# Patient Record
Sex: Female | Born: 1965
Health system: Southern US, Community
[De-identification: ages and names within clinical notes are randomized; demographics above are authoritative.]

## PROBLEM LIST (undated history)

## (undated) DIAGNOSIS — I10 Essential (primary) hypertension: Secondary | ICD-10-CM

## (undated) DIAGNOSIS — E119 Type 2 diabetes mellitus without complications: Secondary | ICD-10-CM

## (undated) HISTORY — PX: JOINT REPLACEMENT: SHX530

---

## 2007-10-25 ENCOUNTER — Ambulatory Visit: Payer: Self-pay | Admitting: Nurse Practitioner

## 2008-10-28 ENCOUNTER — Ambulatory Visit: Payer: Self-pay | Admitting: Nurse Practitioner

## 2010-07-06 ENCOUNTER — Ambulatory Visit: Payer: Self-pay | Admitting: Family

## 2012-05-07 ENCOUNTER — Ambulatory Visit: Payer: Self-pay | Admitting: Internal Medicine

## 2013-05-13 ENCOUNTER — Ambulatory Visit: Payer: Self-pay | Admitting: Nurse Practitioner

## 2014-06-25 DIAGNOSIS — E119 Type 2 diabetes mellitus without complications: Secondary | ICD-10-CM | POA: Insufficient documentation

## 2014-06-25 DIAGNOSIS — I10 Essential (primary) hypertension: Secondary | ICD-10-CM | POA: Insufficient documentation

## 2014-06-25 DIAGNOSIS — M1711 Unilateral primary osteoarthritis, right knee: Secondary | ICD-10-CM | POA: Insufficient documentation

## 2014-07-16 DIAGNOSIS — Z96651 Presence of right artificial knee joint: Secondary | ICD-10-CM | POA: Insufficient documentation

## 2015-04-12 ENCOUNTER — Other Ambulatory Visit: Payer: Self-pay | Admitting: Family Medicine

## 2015-04-12 DIAGNOSIS — Z1231 Encounter for screening mammogram for malignant neoplasm of breast: Secondary | ICD-10-CM

## 2015-04-13 ENCOUNTER — Other Ambulatory Visit: Payer: Self-pay | Admitting: Family Medicine

## 2015-04-13 DIAGNOSIS — R51 Headache: Principal | ICD-10-CM

## 2015-04-13 DIAGNOSIS — R519 Headache, unspecified: Secondary | ICD-10-CM

## 2015-04-14 ENCOUNTER — Ambulatory Visit
Admission: RE | Admit: 2015-04-14 | Discharge: 2015-04-14 | Disposition: A | Discharge status: Home / Self Care | Payer: BC Managed Care – PPO | Source: Ambulatory Visit | Attending: Family Medicine | Admitting: Family Medicine

## 2015-04-14 DIAGNOSIS — Z1231 Encounter for screening mammogram for malignant neoplasm of breast: Secondary | ICD-10-CM | POA: Diagnosis not present

## 2015-04-19 ENCOUNTER — Ambulatory Visit
Admission: RE | Admit: 2015-04-19 | Discharge: 2015-04-19 | Disposition: A | Discharge status: Home / Self Care | Payer: BC Managed Care – PPO | Source: Ambulatory Visit | Attending: Family Medicine | Admitting: Family Medicine

## 2015-04-19 DIAGNOSIS — R519 Headache, unspecified: Secondary | ICD-10-CM

## 2015-04-19 DIAGNOSIS — R51 Headache: Secondary | ICD-10-CM | POA: Diagnosis present

## 2015-07-16 ENCOUNTER — Ambulatory Visit: Payer: BC Managed Care – PPO | Attending: Otolaryngology

## 2015-07-16 DIAGNOSIS — G2581 Restless legs syndrome: Secondary | ICD-10-CM | POA: Insufficient documentation

## 2015-07-16 DIAGNOSIS — R0683 Snoring: Secondary | ICD-10-CM | POA: Insufficient documentation

## 2015-07-16 DIAGNOSIS — R51 Headache: Secondary | ICD-10-CM | POA: Diagnosis present

## 2015-07-16 DIAGNOSIS — G4733 Obstructive sleep apnea (adult) (pediatric): Secondary | ICD-10-CM | POA: Insufficient documentation

## 2016-06-08 DIAGNOSIS — M1990 Unspecified osteoarthritis, unspecified site: Secondary | ICD-10-CM | POA: Insufficient documentation

## 2017-02-26 ENCOUNTER — Other Ambulatory Visit: Payer: Self-pay | Admitting: Student

## 2017-02-26 DIAGNOSIS — Z1231 Encounter for screening mammogram for malignant neoplasm of breast: Secondary | ICD-10-CM

## 2017-03-13 ENCOUNTER — Ambulatory Visit
Admission: RE | Admit: 2017-03-13 | Discharge: 2017-03-13 | Disposition: A | Discharge status: Home / Self Care | Payer: BC Managed Care – PPO | Source: Ambulatory Visit | Attending: Student | Admitting: Student

## 2017-03-13 DIAGNOSIS — Z1231 Encounter for screening mammogram for malignant neoplasm of breast: Secondary | ICD-10-CM | POA: Insufficient documentation

## 2017-03-13 DIAGNOSIS — R928 Other abnormal and inconclusive findings on diagnostic imaging of breast: Secondary | ICD-10-CM | POA: Diagnosis not present

## 2017-03-23 ENCOUNTER — Other Ambulatory Visit: Payer: Self-pay | Admitting: Student

## 2017-03-23 DIAGNOSIS — R928 Other abnormal and inconclusive findings on diagnostic imaging of breast: Secondary | ICD-10-CM

## 2017-03-23 DIAGNOSIS — N6489 Other specified disorders of breast: Secondary | ICD-10-CM

## 2017-03-27 ENCOUNTER — Ambulatory Visit
Admission: RE | Admit: 2017-03-27 | Discharge: 2017-03-27 | Disposition: A | Discharge status: Home / Self Care | Payer: BC Managed Care – PPO | Source: Ambulatory Visit | Attending: Student | Admitting: Student

## 2017-03-27 DIAGNOSIS — N6489 Other specified disorders of breast: Secondary | ICD-10-CM | POA: Diagnosis present

## 2017-03-27 DIAGNOSIS — R928 Other abnormal and inconclusive findings on diagnostic imaging of breast: Secondary | ICD-10-CM

## 2017-03-27 DIAGNOSIS — N6001 Solitary cyst of right breast: Secondary | ICD-10-CM | POA: Diagnosis not present

## 2017-12-24 ENCOUNTER — Ambulatory Visit (HOSPITAL_COMMUNITY)
Admission: RE | Admit: 2017-12-24 | Discharge: 2017-12-24 | Disposition: A | Discharge status: Home / Self Care | Payer: BC Managed Care – PPO | Source: Ambulatory Visit | Attending: Family Medicine | Admitting: Family Medicine

## 2017-12-24 ENCOUNTER — Other Ambulatory Visit (HOSPITAL_COMMUNITY): Payer: Self-pay | Admitting: Family Medicine

## 2017-12-24 DIAGNOSIS — M25511 Pain in right shoulder: Secondary | ICD-10-CM | POA: Diagnosis present

## 2017-12-24 DIAGNOSIS — M19011 Primary osteoarthritis, right shoulder: Secondary | ICD-10-CM | POA: Diagnosis not present

## 2017-12-25 ENCOUNTER — Encounter (INDEPENDENT_AMBULATORY_CARE_PROVIDER_SITE_OTHER): Payer: Self-pay | Admitting: *Deleted

## 2018-02-26 ENCOUNTER — Ambulatory Visit: Payer: Self-pay | Admitting: Orthopaedic Surgery

## 2018-02-27 ENCOUNTER — Encounter: Payer: Self-pay | Admitting: Orthopaedic Surgery

## 2018-02-27 ENCOUNTER — Telehealth: Payer: Self-pay | Admitting: Orthopaedic Surgery

## 2018-02-27 NOTE — Telephone Encounter (Signed)
No note. Letter sent to patient re: missed appointment. Notes entered into Referral Workqueue.

## 2018-03-06 ENCOUNTER — Other Ambulatory Visit: Payer: Self-pay | Admitting: Family Medicine

## 2018-03-06 DIAGNOSIS — Z1231 Encounter for screening mammogram for malignant neoplasm of breast: Secondary | ICD-10-CM

## 2018-03-26 ENCOUNTER — Ambulatory Visit: Payer: Self-pay | Admitting: Orthopaedic Surgery

## 2018-04-15 ENCOUNTER — Ambulatory Visit
Admission: RE | Admit: 2018-04-15 | Discharge: 2018-04-15 | Disposition: A | Discharge status: Home / Self Care | Payer: PRIVATE HEALTH INSURANCE | Source: Ambulatory Visit | Attending: Family Medicine | Admitting: Family Medicine

## 2018-04-15 DIAGNOSIS — Z1231 Encounter for screening mammogram for malignant neoplasm of breast: Secondary | ICD-10-CM | POA: Diagnosis not present

## 2018-04-17 ENCOUNTER — Ambulatory Visit: Payer: Self-pay | Admitting: Orthopaedic Surgery

## 2018-04-25 ENCOUNTER — Ambulatory Visit: Payer: Self-pay | Admitting: Orthopaedic Surgery

## 2018-05-16 ENCOUNTER — Ambulatory Visit: Payer: Self-pay | Admitting: Orthopaedic Surgery

## 2018-06-18 ENCOUNTER — Ambulatory Visit: Payer: PRIVATE HEALTH INSURANCE | Admitting: Orthopaedic Surgery

## 2018-08-21 ENCOUNTER — Encounter (HOSPITAL_COMMUNITY): Payer: Self-pay | Admitting: Emergency Medicine

## 2018-08-21 ENCOUNTER — Other Ambulatory Visit: Payer: Self-pay

## 2018-08-21 ENCOUNTER — Emergency Department (HOSPITAL_COMMUNITY)
Admission: EM | Admit: 2018-08-21 | Discharge: 2018-08-21 | Disposition: A | Discharge status: Home / Self Care | Payer: PRIVATE HEALTH INSURANCE | Attending: Emergency Medicine | Admitting: Emergency Medicine

## 2018-08-21 DIAGNOSIS — M25511 Pain in right shoulder: Secondary | ICD-10-CM | POA: Diagnosis not present

## 2018-08-21 MED ORDER — NAPROXEN 250 MG PO TABS
500.0000 mg | ORAL_TABLET | Freq: Once | ORAL | Status: AC
  Administered 2018-08-21: 500 mg via ORAL
  Filled 2018-08-21: qty 2

## 2018-08-21 MED ORDER — HYDROCODONE-ACETAMINOPHEN 5-325 MG PO TABS: 1.0000 | ORAL_TABLET | ORAL | 0 refills | Status: DC | PRN

## 2018-08-21 MED ORDER — NAPROXEN 500 MG PO TABS: 500.0000 mg | ORAL_TABLET | Freq: Two times a day (BID) | ORAL | 0 refills | Status: DC

## 2018-08-21 NOTE — Discharge Instructions (Addendum)
Apply ice for thirty minutes at a time, four times a day.  Take acetaminophen as needed for additional pain relief. 

## 2018-08-21 NOTE — ED Triage Notes (Signed)
Pt c/o right shoulder pain that she woke up with Sunday, denies injury, no relief with ice, heat, or tylenol

## 2018-08-21 NOTE — ED Provider Notes (Signed)
[facility] EMERGENCY DEPARTMENT Provider Note   CSN: [id number] Arrival date & time: 08/21/18  0033    History   Chief Complaint Right shoulder pain.  HPI Betty Wu is a 53 y.o. female.   The history is provided by the patient.  She complains of pain in the right shoulder for the last 2 days.  Pain sometimes radiates into the right upper arm.  Pain is dull and she rates it a 10/10.  It is worse with movement.  There has been some numbness in all the fingers of her right hand.  She denies any trauma or unusual lifting.  She did have a similar episode several months ago along with his told that it was probably rotator cuff problem and was referred to orthopedics, but never followed through with that consultation because of COVID-19.  She has not taken anything for pain at home.  History reviewed. No pertinent past medical history.  There are no active problems to display for this patient.   History reviewed. No pertinent surgical history.   OB History   No obstetric history on file.      Home Medications    Prior to Admission medications   Not on File    Family History Family History  Problem Relation Age of Onset  . Breast cancer Maternal Aunt        40's  . Breast cancer Maternal Grandmother        50's  . Breast cancer Cousin        2 maternal cousins    Social History Social History   Tobacco Use  . Smoking status: Never Smoker  . Smokeless tobacco: Never Used  Substance Use Topics  . Alcohol use: Not Currently  . Drug use: Not Currently     Allergies   Patient has no known allergies.   Review of Systems Review of Systems  All other systems reviewed and are negative.    Physical Exam Updated Vital Signs BP (!) 146/75 (BP Location: Left Arm)   Pulse 63   Temp 98.1 F (36.7 C) (Oral)   Resp 17   Ht 5\' 4"  (1.626 m)   Wt 93 kg   SpO2 99%   BMI 35.19 kg/m   Physical Exam Vitals signs and nursing note reviewed.    53 year old  female, resting comfortably and in no acute distress. Vital signs are significant for mildly elevated blood pressure. Oxygen saturation is 99%, which is normal. Head is normocephalic and atraumatic. PERRLA, EOMI. Oropharynx is clear. Neck is nontender and supple without adenopathy or JVD. Back is nontender and there is no CVA tenderness. Lungs are clear without rales, wheezes, or rhonchi. Chest is nontender. Heart has regular rate and rhythm without murmur. Abdomen is soft, flat, nontender without masses or hepatosplenomegaly and peristalsis is normoactive. Extremities have no cyanosis or edema.  There is tenderness rather diffusely throughout the right shoulder with maximum tenderness in the anterior deltoid groove.  There is pain with passive range of motion, and range of motion is somewhat restricted.  Rotator cuff impingement signs are present. Skin is warm and dry without rash. Neurologic: Mental status is normal, cranial nerves are intact, there are no motor or sensory deficits.  ED Treatments / Results   Procedures Procedures  Medications Ordered in ED Medications  naproxen (NAPROSYN) tablet 500 mg (has no administration in time range)     Initial Impression / Assessment and Plan / ED Course  I have reviewed  the triage vital signs and the nursing notes.  Right shoulder pain that clinically seems most likely to be rotator cuff impingement syndrome.  Old records are reviewed, and right shoulder x-ray last November showed subacromial spurring that can result in rotator cuff injury.  With no recent trauma, no indication for repeat x-ray.  She is given a prescription for naproxen and a take-home pack of hydrocodone-acetaminophen.  She is referred to orthopedics for further outpatient work-up and treatment.  Final Clinical Impressions(s) / ED Diagnoses   Final diagnoses:  Acute pain of right shoulder    ED Discharge Orders         Ordered    naproxen (NAPROSYN) 500 MG tablet  2  times daily     08/21/18 0158    HYDROcodone-acetaminophen (NORCO) 5-325 MG tablet  Every 4 hours PRN     08/21/18 0203           Dione BoozeGlick, Philipe Laswell, MD 08/21/18 0207

## 2018-08-22 MED FILL — Hydrocodone-Acetaminophen Tab 5-325 MG: ORAL | Qty: 6 | Status: AC

## 2018-08-23 DIAGNOSIS — M7581 Other shoulder lesions, right shoulder: Secondary | ICD-10-CM | POA: Insufficient documentation

## 2018-08-27 ENCOUNTER — Ambulatory Visit: Payer: PRIVATE HEALTH INSURANCE | Admitting: Orthopaedic Surgery

## 2018-09-25 ENCOUNTER — Telehealth: Payer: Self-pay | Admitting: Orthopaedic Surgery

## 2018-09-25 NOTE — Telephone Encounter (Signed)
Betty Wu called this morning stating that she needed to schedule an appointment here.  She then stated that she has been seeing another physician in [facility] but doesn't want to go back there.  She said she had an MRI done and she has a tear.  She wants to be seen here.  I told her that she would need to get all medical notes, MRI report and disc so that Dr. Aline Brochure can review.  She said she would do this.  I did not realize at that time I was speaking with her but she previously has had multiple scheduled appointments with Dr. Luna Glasgow in the past but has either been a no show or called and canceled the appointment.

## 2018-09-26 ENCOUNTER — Encounter (HOSPITAL_COMMUNITY): Payer: Self-pay | Admitting: Emergency Medicine

## 2018-09-26 ENCOUNTER — Emergency Department (HOSPITAL_COMMUNITY)
Admission: EM | Admit: 2018-09-26 | Discharge: 2018-09-26 | Disposition: A | Discharge status: Home / Self Care | Payer: PRIVATE HEALTH INSURANCE | Attending: Emergency Medicine | Admitting: Emergency Medicine

## 2018-09-26 ENCOUNTER — Other Ambulatory Visit: Payer: Self-pay

## 2018-09-26 DIAGNOSIS — R112 Nausea with vomiting, unspecified: Secondary | ICD-10-CM | POA: Diagnosis not present

## 2018-09-26 DIAGNOSIS — R197 Diarrhea, unspecified: Secondary | ICD-10-CM

## 2018-09-26 DIAGNOSIS — Z20828 Contact with and (suspected) exposure to other viral communicable diseases: Secondary | ICD-10-CM | POA: Insufficient documentation

## 2018-09-26 DIAGNOSIS — Z79899 Other long term (current) drug therapy: Secondary | ICD-10-CM | POA: Insufficient documentation

## 2018-09-26 DIAGNOSIS — E876 Hypokalemia: Secondary | ICD-10-CM | POA: Insufficient documentation

## 2018-09-26 DIAGNOSIS — E119 Type 2 diabetes mellitus without complications: Secondary | ICD-10-CM | POA: Insufficient documentation

## 2018-09-26 DIAGNOSIS — I10 Essential (primary) hypertension: Secondary | ICD-10-CM | POA: Diagnosis not present

## 2018-09-26 DIAGNOSIS — Z7189 Other specified counseling: Secondary | ICD-10-CM

## 2018-09-26 HISTORY — DX: Essential (primary) hypertension: I10

## 2018-09-26 HISTORY — DX: Type 2 diabetes mellitus without complications: E11.9

## 2018-09-26 LAB — URINALYSIS, ROUTINE W REFLEX MICROSCOPIC
Bacteria, UA: NONE SEEN
Bilirubin Urine: NEGATIVE
Glucose, UA: NEGATIVE mg/dL
Hgb urine dipstick: NEGATIVE
Ketones, ur: NEGATIVE mg/dL
Nitrite: NEGATIVE
Protein, ur: NEGATIVE mg/dL
Specific Gravity, Urine: 1.019 (ref 1.005–1.030)
pH: 6 (ref 5.0–8.0)

## 2018-09-26 LAB — COMPREHENSIVE METABOLIC PANEL
ALT: 15 U/L (ref 0–44)
AST: 12 U/L — ABNORMAL LOW (ref 15–41)
Albumin: 3.8 g/dL (ref 3.5–5.0)
Alkaline Phosphatase: 57 U/L (ref 38–126)
Anion gap: 6 (ref 5–15)
BUN: 11 mg/dL (ref 6–20)
CO2: 29 mmol/L (ref 22–32)
Calcium: 9.1 mg/dL (ref 8.9–10.3)
Chloride: 102 mmol/L (ref 98–111)
Creatinine, Ser: 0.64 mg/dL (ref 0.44–1.00)
GFR calc Af Amer: >60 mL/min (ref >60)
GFR calc non Af Amer: >60 mL/min (ref >60)
Glucose, Bld: 86 mg/dL (ref 70–99)
Potassium: 3.3 mmol/L — ABNORMAL LOW (ref 3.5–5.1)
Sodium: 137 mmol/L (ref 135–145)
Total Bilirubin: 0.7 mg/dL (ref 0.3–1.2)
Total Protein: 7.9 g/dL (ref 6.5–8.1)

## 2018-09-26 LAB — CBC
HCT: 42.8 % (ref 36.0–46.0)
Hemoglobin: 13.2 g/dL (ref 12.0–15.0)
MCH: 27.1 pg (ref 26.0–34.0)
MCHC: 30.8 g/dL (ref 30.0–36.0)
MCV: 87.9 fL (ref 80.0–100.0)
Platelets: 262 10*3/uL (ref 150–400)
RBC: 4.87 MIL/uL (ref 3.87–5.11)
RDW: 14.3 % (ref 11.5–15.5)
WBC: 8.9 10*3/uL (ref 4.0–10.5)
nRBC: 0 % (ref 0.0–0.2)

## 2018-09-26 LAB — POC URINE PREG, ED: Preg Test, Ur: NEGATIVE

## 2018-09-26 LAB — LIPASE, BLOOD: Lipase: 26 U/L (ref 11–51)

## 2018-09-26 MED ORDER — SODIUM CHLORIDE 0.9 % IV BOLUS
1000.0000 mL | Freq: Once | INTRAVENOUS | Status: AC
  Administered 2018-09-26: 1000 mL via INTRAVENOUS

## 2018-09-26 MED ORDER — DICYCLOMINE HCL 20 MG PO TABS: 20.0000 mg | ORAL_TABLET | Freq: Two times a day (BID) | ORAL | 0 refills | Status: DC

## 2018-09-26 MED ORDER — MORPHINE SULFATE (PF) 4 MG/ML IV SOLN
4.0000 mg | Freq: Once | INTRAVENOUS | Status: AC
  Administered 2018-09-26: 14:00:00 4 mg via INTRAVENOUS
  Filled 2018-09-26: qty 1

## 2018-09-26 MED ORDER — METOCLOPRAMIDE HCL 5 MG/ML IJ SOLN
10.0000 mg | Freq: Once | INTRAMUSCULAR | Status: AC
  Administered 2018-09-26: 10 mg via INTRAVENOUS
  Filled 2018-09-26: qty 2

## 2018-09-26 MED ORDER — ONDANSETRON 4 MG PO TBDP: 4.0000 mg | ORAL_TABLET | Freq: Three times a day (TID) | ORAL | 0 refills | Status: DC | PRN

## 2018-09-26 MED ORDER — POTASSIUM CHLORIDE CRYS ER 20 MEQ PO TBCR
40.0000 meq | EXTENDED_RELEASE_TABLET | Freq: Once | ORAL | Status: AC
  Administered 2018-09-26: 40 meq via ORAL
  Filled 2018-09-26: qty 2

## 2018-09-26 MED ORDER — SODIUM CHLORIDE 0.9% FLUSH: 3.0000 mL | Freq: Once | INTRAVENOUS | Status: DC

## 2018-09-26 NOTE — ED Notes (Signed)
Patient given ginger ale as PO Challenge

## 2018-09-26 NOTE — Discharge Instructions (Signed)
We will contact you with the results of your remaining lab work when it is available. Take the Zofran as needed for nausea, Bentyl as needed for abdominal discomfort.  Please follow-up with your primary care provider. Return to the ED if you start to have worsening symptoms, increased abdominal pain or develop a fever, blood in your stool or vomit, lightheadedness or loss of consciousness.

## 2018-09-26 NOTE — ED Triage Notes (Signed)
Patient reports abdominal pain with vomiting and diarrhea that started last night.

## 2018-09-26 NOTE — ED Provider Notes (Signed)
[facility]ENC[facility]PART[facility] Provider Note   CSN: [id number] Arrival date & time: 09/26/18  1221    History   Chief Complaint Chief Complaint  Patient presents with   Abdominal Pain    HPI Betty Wu is a 53 y.o. female with a past medical history of HTN, who presents to ED for generalized abdominal cramping, several episodes of nonbloody diarrhea, and several episodes of NBNB emesis since last night. States that she woke up yesterday with a decreased appetite.  She ate a meal at [employer] for lunch.  States that she did not eat dinner because of her feelings of nausea and abdominal cramping.  States that she is unable to get her symptoms under control but has not tried any over-the-counter medications to help with her symptoms.  She denies any sick contacts with similar symptoms, known COVID-19 exposures, urinary or vaginal complaints, fever, shortness of breath.  Denies any prior abdominal surgeries.  Denies any alcohol, tobacco or other drug use.     HPI  Past Medical History:  Diagnosis Date   Diabetes mellitus without complication (HCC)    prediabetic   Hypertension     There are no active problems to display for this patient.   Past Surgical History:  Procedure Laterality Date   JOINT REPLACEMENT       OB History    Gravida  3   Para  2   Term  1   Preterm  1   AB  1   Living        SAB  1   TAB      Ectopic      Multiple      Live Births               Home Medications    Prior to Admission medications   Medication Sig Start Date End Date Taking? Authorizing Provider  lisinopril-hydrochlorothiazide (ZESTORETIC) 20-25 MG tablet Take 1 tablet by mouth daily. 09/07/18  Yes [provider]  loratadine (CLARITIN) 10 MG tablet Take 10 mg by mouth daily. 07/26/18  Yes [provider]  dicyclomine (BENTYL) 20 MG tablet Take 1 tablet (20 mg total) by mouth 2 (two) times daily. 09/26/18   Kang Ishida, PA-C    HYDROcodone-acetaminophen (NORCO) 5-325 MG tablet Take 1 tablet by mouth every 4 (four) hours as needed for moderate pain. Patient not taking: Reported on 09/26/2018 08/21/18   Dione BoozeGlick, David, MD  naproxen (NAPROSYN) 500 MG tablet Take 1 tablet (500 mg total) by mouth 2 (two) times daily. Patient not taking: Reported on 09/26/2018 08/21/18   Dione BoozeGlick, David, MD  ondansetron (ZOFRAN ODT) 4 MG disintegrating tablet Take 1 tablet (4 mg total) by mouth every 8 (eight) hours as needed for nausea or vomiting. 09/26/18   Dietrich PatesKhatri, Srihitha Tagliaferri, PA-C    Family History Family History  Problem Relation Age of Onset   Breast cancer Maternal Aunt        40's   Breast cancer Maternal Grandmother        50's   Breast cancer Cousin        2 maternal cousins    Social History Social History   Tobacco Use   Smoking status: Never Smoker   Smokeless tobacco: Never Used  Substance Use Topics   Alcohol use: Not Currently   Drug use: Not Currently     Allergies   Patient has no known allergies.   Review of Systems Review of Systems  Constitutional: Negative  for appetite change, chills and fever.  HENT: Negative for ear pain, rhinorrhea, sneezing and sore throat.   Eyes: Negative for photophobia and visual disturbance.  Respiratory: Negative for cough, chest tightness, shortness of breath and wheezing.   Cardiovascular: Negative for chest pain and palpitations.  Gastrointestinal: Positive for abdominal pain, diarrhea, nausea and vomiting. Negative for blood in stool and constipation.  Genitourinary: Negative for dysuria, hematuria and urgency.  Musculoskeletal: Negative for myalgias.  Skin: Negative for rash.  Neurological: Negative for dizziness, weakness and light-headedness.     Physical Exam Updated Vital Signs BP 139/89 (BP Location: Right Arm)    Pulse 82    Temp 98.4 F (36.9 C) (Oral)    Resp 20    Ht 5\' 4"  (1.626 m)    Wt 95.3 kg    SpO2 100%    BMI 36.05 kg/m   Physical Exam Vitals  signs and nursing note reviewed.  Constitutional:      General: She is not in acute distress.    Appearance: She is well-developed.  HENT:     Head: Normocephalic and atraumatic.     Nose: Nose normal.  Eyes:     General: No scleral icterus.       Left eye: No discharge.     Conjunctiva/sclera: Conjunctivae normal.  Neck:     Musculoskeletal: Normal range of motion and neck supple.  Cardiovascular:     Rate and Rhythm: Normal rate and regular rhythm.     Heart sounds: Normal heart sounds. No murmur. No friction rub. No gallop.   Pulmonary:     Effort: Pulmonary effort is normal. No respiratory distress.     Breath sounds: Normal breath sounds.  Abdominal:     General: Bowel sounds are normal. There is no distension.     Palpations: Abdomen is soft.     Tenderness: There is generalized abdominal tenderness. There is no guarding or rebound.  Musculoskeletal: Normal range of motion.  Skin:    General: Skin is warm and dry.     Findings: No rash.  Neurological:     Mental Status: She is alert.     Motor: No abnormal muscle tone.     Coordination: Coordination normal.      ED Treatments / Results  Labs (all labs ordered are listed, but only abnormal results are displayed) Labs Reviewed  COMPREHENSIVE METABOLIC PANEL - Abnormal; Notable for the following components:      Result Value   Potassium 3.3 (*)    AST 12 (*)    All other components within normal limits  URINALYSIS, ROUTINE W REFLEX MICROSCOPIC - Abnormal; Notable for the following components:   APPearance HAZY (*)    Leukocytes,Ua TRACE (*)    All other components within normal limits  NOVEL CORONAVIRUS, NAA (HOSPITAL ORDER, SEND-OUT TO REF LAB)  LIPASE, BLOOD  CBC  POC URINE PREG, ED    EKG None  Radiology No results found.  Procedures Procedures (including critical care time)  Medications Ordered in ED Medications  sodium chloride flush (NS) 0.9 % injection 3 mL (3 mLs Intravenous Not Given  09/26/18 1259)  sodium chloride 0.9 % bolus 1,000 mL (1,000 mLs Intravenous New Bag/Given 09/26/18 1347)  metoCLOPramide (REGLAN) injection 10 mg (10 mg Intravenous Given 09/26/18 1347)  morphine 4 MG/ML injection 4 mg (4 mg Intravenous Given 09/26/18 1347)  potassium chloride SA (K-DUR) CR tablet 40 mEq (40 mEq Oral Given 09/26/18 1436)     Initial Impression /  Assessment and Plan / ED Course  I have reviewed the triage vital signs and the nursing notes.  Pertinent labs & imaging results that were available during my care of the patient were reviewed by me and considered in my medical decision making (see chart for details).        Betty Wu was evaluated in Emergency Department on 09/26/18  for the symptoms described in the history of present illness. He/she was evaluated in the context of the global COVID-19 pandemic, which necessitated consideration that the patient might be at risk for infection with the SARS-CoV-2 virus that causes COVID-19. Institutional protocols and algorithms that pertain to the evaluation of patients at risk for COVID-19 are in a state of rapid change based on information released by regulatory bodies including the CDC and federal and state organizations. These policies and algorithms were followed during the patient's care in the ED.  53 year old female presents to ED for generalized abdominal cramping, vomiting, diarrhea that began yesterday.  She has not tried any interventions prior to arrival.  No sick contacts with similar symptoms.  Denies any fever, urinary symptoms, vaginal complaints or possibility of pregnancy.  On exam abdomen is generally tender without rebound or guarding.  No focal tenderness noted.  Her vital signs are reassuring, she appears hemodynamically stable.  Lab work significant for mild hypokalemia of 3.3 which was repleted orally, urinalysis with trace leukocytes.  Lipase, CBC unremarkable.  Patient given fluids, antiemetics and pain medication  with resolution of her symptoms.  Repeat abdominal exams are benign.  Patient able to tolerate p.o. potassium without difficulty.  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  I feel this is reasonable and will do send out COVID test.  Advised to quarantine until results.  Will give work note.  Give antiemetics and Bentyl for symptoms and advised PCP follow-up.  Suspect that her symptoms are viral in nature, doubt appendicitis, cholecystitis or other surgical emergent cause of her symptoms.  Will give strict return precautions for return to the ED.  Patient is hemodynamically stable, in NAD, and able to ambulate in the ED. Evaluation does not show pathology that would require ongoing emergent intervention or inpatient treatment. I explained the diagnosis to the patient. Pain has been managed and has no complaints prior to discharge. Patient is comfortable with above plan and is stable for discharge at this time. All questions were answered prior to disposition. Strict return precautions for returning to the ED were discussed. Encouraged follow up with PCP.   An After Visit Summary was printed and given to the patient.   Portions of this note were generated with Scientist, clinical (histocompatibility and immunogenetics)Dragon dictation software. Dictation errors may occur despite best attempts at proofreading.  Final Clinical Impressions(s) / ED Diagnoses   Final diagnoses:  Nausea vomiting and diarrhea  Educated About Covid-19 Virus Infection  Hypokalemia    ED Discharge Orders         Ordered    ondansetron (ZOFRAN ODT) 4 MG disintegrating tablet  Every 8 hours PRN     09/26/18 1431    dicyclomine (BENTYL) 20 MG tablet  2 times daily     09/26/18 [address]1431           Norinne Jeane, PA-C 09/26/18 1442    Milagros Lollykstra, Richard S, MD 09/27/18 [phone]

## 2018-09-27 LAB — SARS CORONAVIRUS 2 (TAT 6-24 HRS): SARS Coronavirus 2: NEGATIVE

## 2018-10-30 ENCOUNTER — Ambulatory Visit: Payer: Self-pay | Admitting: Orthopedic Surgery

## 2018-10-30 ENCOUNTER — Encounter: Payer: Self-pay | Admitting: Orthopedic Surgery

## 2018-10-30 ENCOUNTER — Other Ambulatory Visit: Payer: Self-pay

## 2018-10-30 VITALS — BP 125/74 | HR 77 | Ht 64.0 in | Wt 212.0 lb

## 2018-10-30 DIAGNOSIS — E6609 Other obesity due to excess calories: Secondary | ICD-10-CM | POA: Diagnosis not present

## 2018-10-30 DIAGNOSIS — I10 Essential (primary) hypertension: Secondary | ICD-10-CM | POA: Diagnosis not present

## 2018-10-30 DIAGNOSIS — T464X5A Adverse effect of angiotensin-converting-enzyme inhibitors, initial encounter: Secondary | ICD-10-CM | POA: Diagnosis not present

## 2018-10-30 DIAGNOSIS — Z23 Encounter for immunization: Secondary | ICD-10-CM | POA: Diagnosis not present

## 2018-10-30 DIAGNOSIS — R7309 Other abnormal glucose: Secondary | ICD-10-CM | POA: Diagnosis not present

## 2018-10-30 DIAGNOSIS — Z6836 Body mass index (BMI) 36.0-36.9, adult: Secondary | ICD-10-CM | POA: Diagnosis not present

## 2018-10-30 DIAGNOSIS — M75121 Complete rotator cuff tear or rupture of right shoulder, not specified as traumatic: Secondary | ICD-10-CM

## 2018-10-30 MED ORDER — HYDROCODONE-ACETAMINOPHEN 5-325 MG PO TABS: 1.0000 | ORAL_TABLET | Freq: Four times a day (QID) | ORAL | 0 refills | Status: AC | PRN

## 2018-10-30 MED ORDER — IBUPROFEN 800 MG PO TABS: 800.0000 mg | ORAL_TABLET | Freq: Three times a day (TID) | ORAL | 0 refills | Status: DC | PRN

## 2018-10-30 NOTE — Patient Instructions (Addendum)
You have received an injection of steroids into the joint. 15% of patients will have increased pain within the 24 hours postinjection.   This is transient and will go away.   We recommend that you use ice packs on the injection site for 20 minutes every 2 hours and extra strength Tylenol 2 tablets every 8 as needed until the pain resolves.  If you continue to have pain after taking the Tylenol and using the ice please call the office for further instructions.  Physical therapy will call you    Rotator Cuff Tear  A rotator cuff tear is a partial or complete tear of the cord-like bands (tendons) that connect muscle to bone in the rotator cuff. The rotator cuff is a group of muscles and tendons that surround the shoulder joint and keep the upper arm bone (humerus) in the shoulder socket. The tear can occur suddenly (acute tear) or can develop over a long period of time (chronic tear). What are the causes? Acute tears may be caused by:  A fall, especially on an outstretched arm.  Lifting very heavy objects with a jerking motion. Chronic tears may be caused by overuse of the muscles. This may happen in sports, physical work, or activities in which your arm repeatedly moves over your head. What increases the risk? This condition is more likely to occur in:  Athletes and workers who frequently use their shoulder or reach over their heads. This may include activities such as: ? Tennis. ? Baseball and softball. ? Swimming and rowing. ? Weightlifting. ? Architect work. ? Painting.  People who smoke.  Older people who have arthritis or poor blood supply. These can make the muscles and tendons weaker. What are the signs or symptoms? Symptoms of this condition depend on the type and severity of the injury:  An acute tear may include a sudden tearing feeling, followed by severe pain that goes from your upper shoulder, down your arm, and toward your elbow.  A chronic tear includes a  gradual weakness and decreased shoulder motion as the pain gets worse. The pain is usually worse at night. Both types may have symptoms such as:  Pain that spreads (radiates) from the shoulder to the upper arm.  Swelling and tenderness in front of the shoulder.  Decreased range of motion.  Pain when: ? Reaching, pulling, or lifting the arm above the head. ? Lowering the arm from above the head.  Not being able to raise your arm out to the side.  Difficulty placing the arm behind your back. How is this diagnosed? This condition is diagnosed with a medical history and physical exam. Imaging tests may also be done, including:  X-rays.  MRI.  Ultrasound.  CT or MR arthrogram. During this test, a contrast material is injected into your shoulder and then images are taken. How is this treated? Treatment for this condition depends on the type and severity of the condition. In less severe cases, treatment may include:  Rest. This may be done with a sling that holds the shoulder still (immobilization). Your health care provider may also recommend avoiding activities that involve lifting your arm over your head.  Icing the shoulder.  Anti-inflammatory medicines, such as aspirin or ibuprofen.  Strengthening and stretching exercises. Your health care provider may recommend specific exercises to improve your range of motion and strengthen your shoulder. In more severe cases, treatment may include:  Physical therapy.  Steroid injections.  Surgery. Follow these instructions at home: Managing pain,  stiffness, and swelling  If directed, put ice on the injured area. ? If you have a removable sling, remove it as told by your health care provider. ? Put ice in a plastic bag. ? Place a towel between your skin and the bag. ? Leave the ice on for 20 minutes, 2-3 times a day.  Raise (elevate) the injured area above the level of your heart while you are lying down.  Find a comfortable  sleeping position or sleep on a recliner, if available.  Move your fingers often to avoid stiffness and to lessen swelling.  Once the swelling has gone down, your health care provider may direct you to apply heat to relax the muscles. Use the heat source that your health care provider recommends, such as a moist heat pack or a heating pad. ? Place a towel between your skin and the heat source. ? Leave the heat on for 20-30 minutes. ? Remove the heat if your skin turns bright red. This is especially important if you are unable to feel pain, heat, or cold. You may have a greater risk of getting burned. If you have a sling:  Wear the sling as told by your health care provider. Remove it only as told by your health care provider.  Loosen the sling if your fingers tingle, become numb, or turn cold and blue.  Keep the sling clean.  If the sling is not waterproof: ? Do not let it get wet. ? Cover it with a watertight covering when you take a bath or a shower. Driving  Do not drive or use heavy machinery while taking prescription pain medicine.  Ask your health care provider when it is safe to drive if you have a sling on your arm. Activity  Rest your shoulder as told by your health care provider.  Return to your normal activities as told by your health care provider. Ask your health care provider what activities are safe for you.  Do any exercises or stretches as told by your health care provider. General instructions  Do not use any products that contain nicotine or tobacco, such as cigarettes and e-cigarettes. If you need help quitting, ask your health care provider.  Take over-the-counter and prescription medicines only as told by your health care provider.  Keep all follow-up visits as told by your health care provider. This is important. Contact a health care provider if:  Your pain gets worse.  You have new pain in your arm, hands, or fingers.  Medicine does not help your  pain. Get help right away if:  Your arm, hand, or fingers are numb or tingling.  Your arm, hand, or fingers are swollen or painful or they turn white or blue.  Your hand or fingers on your injured arm are colder than your other hand. Summary  A rotator cuff tear is a partial or complete tear of the cord-like bands (tendons) that connect muscle to bone in the rotator cuff.  The tear can occur suddenly (acute tear) or can develop over a long period of time (chronic tear).  Treatment generally includes rest, anti-inflammatory medicines, and icing. In some cases, physical therapy and steroid injections may be needed. In severe cases, surgery may be needed. This information is not intended to replace advice given to you by your health care provider. Make sure you discuss any questions you have with your health care provider. Document Released: 01/28/2000 Document Revised: 01/12/2017 Document Reviewed: 04/17/2016 Elsevier Patient Education  2020 [employer]  

## 2018-10-30 NOTE — Progress Notes (Signed)
Betty Wu  10/30/2018  HISTORY SECTION :  Chief Complaint  Patient presents with  . Shoulder Pain    right    HPI The patient presents for evaluation of right shoulder previously seen at [facility] told she needed surgery after MRI showed a large rotator cuff tear  Complains of right shoulder pain some stiffness occasional loss of motion but her range of motion has improved Quality dull ache Severity 10   Review of Systems  All other systems reviewed and are negative.    has a past medical history of Diabetes mellitus without complication ([place]) and Hypertension.   Past Surgical History:  Procedure Laterality Date  . JOINT REPLACEMENT      Body mass index is 36.39 kg/m.   No Known Allergies   Current Outpatient Medications:  .  loratadine (CLARITIN) 10 MG tablet, Take 10 mg by mouth daily., Disp: , Rfl:  .  lisinopril-hydrochlorothiazide (ZESTORETIC) 20-25 MG tablet, Take 1 tablet by mouth daily., Disp: , Rfl:    PHYSICAL EXAM SECTION: 1) BP 125/74   Pulse 77   Ht 5\' 4"  (1.626 m)   Wt 212 lb (96.2 kg)   BMI 36.39 kg/m   Body mass index is 36.39 kg/m. General appearance: Well-developed well-nourished no gross deformities  2) Cardiovascular normal pulse and perfusion in the upper extremities normal color without edema  3) Neurologically deep tendon reflexes are equal and normal, no sensation loss or deficits no pathologic reflexes  4) Psychological: Awake alert and oriented x3 mood and affect normal  5) Skin no lacerations or ulcerations no nodularity no palpable masses, no erythema or nodularity  6) Musculoskeletal:  Left shoulder full range of motion no tenderness shoulder stable abduction external rotation no muscle weakness  Right shoulder mild muscle weakness but not bad active motion to 90 degrees before she has pain then she can abduct greater than 90 and flex approximately 150  MEDICAL DECISION SECTION:  Encounter Diagnosis  Name Primary?  Marland Kitchen  Nontraumatic complete tear of right rotator cuff Yes    Imaging MRI shows a rotator cuff tear complete front to back 2-1/2 cm just in front of the humeral head in terms of retraction there is mild atrophy  Plan:  (Rx., Inj., surg., Frx, MRI/CT, XR:2)  Procedure note the subacromial injection shoulder RIGHT    Verbal consent was obtained to inject the  RIGHT   Shoulder  Timeout was completed to confirm the injection site is a subacromial space of the  RIGHT  shoulder   Medication used Depo-Medrol 40 mg and lidocaine 1% 3 cc  Anesthesia was provided by ethyl chloride  The injection was performed in the RIGHT  posterior subacromial space. After pinning the skin with alcohol and anesthetized the skin with ethyl chloride the subacromial space was injected using a 20-gauge needle. There were no complications  Sterile dressing was applied.  Meds ordered this encounter  Medications  . ibuprofen (ADVIL) 800 MG tablet    Sig: Take 1 tablet (800 mg total) by mouth every 8 (eight) hours as needed.    Dispense:  30 tablet    Refill:  0  . HYDROcodone-acetaminophen (NORCO/VICODIN) 5-325 MG tablet    Sig: Take 1 tablet by mouth every 6 (six) hours as needed for up to 7 days for moderate pain.    Dispense:  28 tablet    Refill:  0   As far as I see she does have retraction and atrophy but she can abduct  the arm and flex it at least as much as I can give her surgically.  Recommend physical therapy cortisone injection then if she does not improve and is unhappy with the shoulder I told her we can proceed with the cuff repair may need a graft   Procedure note the subacromial injection shoulder RIGHT    Verbal consent was obtained to inject the  RIGHT   Shoulder  Timeout was completed to confirm the injection site is a subacromial space of the  RIGHT  shoulder   Medication used Depo-Medrol 40 mg and lidocaine 1% 3 cc  Anesthesia was provided by ethyl chloride  The injection was  performed in the RIGHT  posterior subacromial space. After pinning the skin with alcohol and anesthetized the skin with ethyl chloride the subacromial space was injected using a 20-gauge needle. There were no complications  Sterile dressing was applied.   PT  RE CK 6 WEEKS    3:51 PM Betty CanadaStanley [name], MD  10/30/2018

## 2018-11-05 ENCOUNTER — Encounter: Payer: Self-pay | Admitting: *Deleted

## 2018-11-20 ENCOUNTER — Ambulatory Visit: Payer: PRIVATE HEALTH INSURANCE

## 2018-11-27 ENCOUNTER — Ambulatory Visit (INDEPENDENT_AMBULATORY_CARE_PROVIDER_SITE_OTHER): Payer: Self-pay | Admitting: *Deleted

## 2018-11-27 ENCOUNTER — Other Ambulatory Visit: Payer: Self-pay

## 2018-11-27 DIAGNOSIS — Z1211 Encounter for screening for malignant neoplasm of colon: Secondary | ICD-10-CM

## 2018-11-27 MED ORDER — PEG 3350-KCL-NA BICARB-NACL 420 G PO SOLR: 4000.0000 mL | Freq: Once | ORAL | 0 refills | Status: AC

## 2018-11-27 NOTE — Progress Notes (Signed)
Ok to schedule.

## 2018-11-27 NOTE — Patient Instructions (Addendum)
Betty Wu  05-27-1965 MRN: [id number]     Procedure Date: 05/09/2019 Time to register: 8:30 am Place to register: [facility] Procedure Time: 9:30 am Scheduled provider: Dr. Gala Romney    PREPARATION FOR COLONOSCOPY WITH SUPREP BOWEL PREP KIT  Note: Suprep Bowel Prep Kit is a split-dose (2day) regimen. Consumption of BOTH 6-ounce bottles is required for a complete prep.  Please notify us immediately if you are diabetic, take iron supplements, or if you are on Coumadin or any other blood thinners.  Please hold the following medications: n/a                                                                                                                                                  2 DAYS BEFORE PROCEDURE:  DATE: 05/07/2019   DAY: Wednesday Begin clear liquid diet AFTER your lunch meal. NO SOLID FOODS after this point.  1 DAY BEFORE PROCEDURE:  DATE: 05/08/2019  DAY: Thursday Continue clear liquids the entire day - NO SOLID FOOD.   Diabetic medications adjustments for today: n/a  At 6:00pm: Complete steps 1 through 4 below, using ONE (1) 6-ounce bottle, before going to bed. Step 1:  Pour ONE (1) 6-ounce bottle of SUPREP liquid into the mixing container.  Step 2:  Add cool drinking water to the 16 ounce line on the container and mix.  Note: Dilute the solution concentrate as directed prior to use. Step 3:  DRINK ALL the liquid in the container. Step 4:  You MUST drink an additional two (2) or more 16 ounce containers of water over the next one (1) hour.   Continue clear liquids.  DAY OF PROCEDURE:   DATE: 05/09/2019   DAY: Friday If you take medications for your heart, blood pressure, or breathing, you may take these medications.  Diabetic medications adjustments for today: n/a  5 hours before your procedure at:  4:30 am Step 1:  Pour ONE (1) 6-ounce bottle of SUPREP liquid into the mixing container.  Step 2:  Add cool drinking water to the 16 ounce line on the  container and mix.  Note: Dilute the solution concentrate as directed prior to use. Step 3:  DRINK ALL the liquid in the container. Step 4:  You MUST drink an additional two (2) or more 16 ounce containers of water over the next one (1) hour. You MUST complete the final glass of water at least 3 hours before your colonoscopy. Nothing by mouth past 6:30 am.  You may take your morning medications with sip of water unless we have instructed otherwise.    Please see below for Dietary Information.  CLEAR LIQUIDS INCLUDE:  Water Jello (NOT red in color)   Ice Popsicles (NOT red in color)   Tea (sugar ok, no milk/cream) Powdered fruit flavored drinks  Coffee (sugar ok, no  milk/cream) Gatorade/ Lemonade/ Kool-Aid  (NOT red in color)   Juice: apple, white grape, white cranberry Soft drinks  Clear bullion, consomme, broth (fat free beef/chicken/vegetable)  Carbonated beverages (any kind)  Strained chicken noodle soup Hard Candy   Remember: Clear liquids are liquids that will allow you to see your fingers on the other side of a clear glass. Be sure liquids are NOT red in color, and not cloudy, but CLEAR.  DO NOT EAT OR DRINK ANY OF THE FOLLOWING:  Dairy products of any kind   Cranberry juice Tomato juice / V8 juice   Grapefruit juice Orange juice     Red grape juice  Do not eat any solid foods, including such foods as: cereal, oatmeal, yogurt, fruits, vegetables, creamed soups, eggs, bread, crackers, pureed foods in a blender, etc.   HELPFUL HINTS FOR DRINKING PREP SOLUTION:   Make sure prep is extremely cold. Mix and refrigerate the the morning of the prep. You may also put in the freezer.   You may try mixing some Crystal Light or Country Time Lemonade if you prefer. Mix in small amounts; add more if necessary.  Try drinking through a straw  Rinse mouth with water or a mouthwash between glasses, to remove after-taste.  Try sipping on a cold beverage /ice/ popsicles between glasses of  prep.  Place a piece of sugar-free hard candy in mouth between glasses.  If you become nauseated, try consuming smaller amounts, or stretch out the time between glasses. Stop for 30-60 minutes, then slowly start back drinking.     OTHER INSTRUCTIONS  You will need a responsible adult at least 51 years of age to accompany you and drive you home. This person must remain in the waiting room during your procedure. The hospital will cancel your procedure if you do not have a responsible adult with you.   1. Wear loose fitting clothing that is easily removed. 2. Leave jewelry and other valuables at home.  3. Remove all body piercing jewelry and leave at home. 4. Total time from sign-in until discharge is approximately 2-3 hours. 5. You should go home directly after your procedure and rest. You can resume normal activities the day after your procedure. 6. The day of your procedure you should not:  Drive  Make legal decisions  Operate machinery  Drink alcohol  Return to work   You may call the office (Dept: [phone]) before 5:00pm, or page the doctor on call [phone]) after 5:00pm, for further instructions, if necessary.   Insurance Information YOU WILL NEED TO CHECK WITH YOUR INSURANCE COMPANY FOR THE BENEFITS OF COVERAGE YOU HAVE FOR THIS PROCEDURE.  UNFORTUNATELY, NOT ALL INSURANCE COMPANIES HAVE BENEFITS TO COVER ALL OR PART OF THESE TYPES OF PROCEDURES.  IT IS YOUR RESPONSIBILITY TO CHECK YOUR BENEFITS, HOWEVER, WE WILL BE GLAD TO ASSIST YOU WITH ANY CODES YOUR INSURANCE COMPANY MAY NEED.    PLEASE NOTE THAT MOST INSURANCE COMPANIES WILL NOT COVER A SCREENING COLONOSCOPY FOR PEOPLE UNDER THE AGE OF 50  IF YOU HAVE BCBS INSURANCE, YOU MAY HAVE BENEFITS FOR A SCREENING COLONOSCOPY BUT IF POLYPS ARE FOUND THE DIAGNOSIS WILL CHANGE AND THEN YOU MAY HAVE A DEDUCTIBLE THAT WILL NEED TO BE MET. SO PLEASE MAKE SURE YOU CHECK YOUR BENEFITS FOR A SCREENING COLONOSCOPY AS WELL AS A  DIAGNOSTIC COLONOSCOPY.

## 2018-11-27 NOTE — Progress Notes (Signed)
Gastroenterology Pre-Procedure Review  Request Date: 11/27/2018 Requesting Physician: Dr. Hilma Favors, no previous TCS  PATIENT REVIEW QUESTIONS: The patient responded to the following health history questions as indicated:    1. Diabetes Melitis: no 2. Joint replacements in the past 12 months: no 3. Major health problems in the past 3 months: no 4. Has an artificial valve or MVP: no 5. Has a defibrillator: no 6. Has been advised in past to take antibiotics in advance of a procedure like teeth cleaning: no 7. Family history of colon cancer: no  8. Alcohol Use: no 9. Illicit drug Use: no 10. History of sleep apnea: no  11. History of coronary artery or other vascular stents placed within the last 12 months: no 12. History of any prior anesthesia complications: no 13. There is no height or weight on file to calculate BMI.ht: 5'4 wt: 210 lbs    MEDICATIONS & ALLERGIES:    Patient reports the following regarding taking any blood thinners:   Plavix? no Aspirin? no Coumadin? no Brilinta? no Xarelto? no Eliquis? no Pradaxa? no Savaysa? no Effient? no  Patient confirms/reports the following medications:  Current Outpatient Medications  Medication Sig Dispense Refill  . ibuprofen (ADVIL) 800 MG tablet Take 1 tablet (800 mg total) by mouth every 8 (eight) hours as needed. (Patient taking differently: Take 800 mg by mouth as needed. ) 30 tablet 0  . lisinopril-hydrochlorothiazide (ZESTORETIC) 20-25 MG tablet Take 1 tablet by mouth daily.    Marland Kitchen loratadine (CLARITIN) 10 MG tablet Take 10 mg by mouth daily.     No current facility-administered medications for this visit.     Patient confirms/reports the following allergies:  No Known Allergies  No orders of the defined types were placed in this encounter.   AUTHORIZATION INFORMATION Primary Insurance: [employer],  [place] #: [id number],  Group #: [id number] Pre-Cert / Auth required: No, not required   SCHEDULE INFORMATION: Procedure has  been scheduled as follows:  Date: 01/29/2019, Time: 9:30 Location: [facility] with Dr. Gala Romney  This Gastroenterology Pre-Precedure Review Form is being routed to the following provider(s): Walden Field, NP

## 2018-11-27 NOTE — Addendum Note (Signed)
Addended by: Metro Kung on: 11/27/2018 01:30 PM   Modules accepted: Orders, SmartSet

## 2018-12-11 ENCOUNTER — Encounter: Payer: Self-pay | Admitting: Orthopedic Surgery

## 2018-12-11 ENCOUNTER — Ambulatory Visit: Payer: BC Managed Care – PPO | Admitting: Orthopedic Surgery

## 2018-12-11 ENCOUNTER — Other Ambulatory Visit: Payer: Self-pay

## 2018-12-11 VITALS — BP 126/63 | HR 68 | Ht 64.0 in | Wt 212.0 lb

## 2018-12-11 DIAGNOSIS — M75121 Complete rotator cuff tear or rupture of right shoulder, not specified as traumatic: Secondary | ICD-10-CM

## 2018-12-11 NOTE — Progress Notes (Signed)
Follow-up visit for Betty Wu she came to [place] a second opinion for possible surgery and rotator cuff we saw her gave her an injection sent her to therapy she says she is significantly better  Her exam shows free and easy motion of the right shoulder with near full range of motion  We have asked her to continue her home exercises and see [place] in 3 months for recheck  Encounter Diagnosis  Name Primary?  Marland Kitchen Nontraumatic complete tear of right rotator cuff Yes

## 2018-12-11 NOTE — Patient Instructions (Signed)
Home exercises  

## 2019-01-15 DIAGNOSIS — Z111 Encounter for screening for respiratory tuberculosis: Secondary | ICD-10-CM | POA: Diagnosis not present

## 2019-01-27 ENCOUNTER — Other Ambulatory Visit (HOSPITAL_COMMUNITY)
Admission: RE | Admit: 2019-01-27 | Discharge: 2019-01-27 | Disposition: A | Discharge status: Home / Self Care | Payer: BC Managed Care – PPO | Source: Ambulatory Visit | Attending: Internal Medicine | Admitting: Internal Medicine

## 2019-01-27 ENCOUNTER — Telehealth: Payer: Self-pay | Admitting: *Deleted

## 2019-01-27 NOTE — Telephone Encounter (Signed)
Pt called and rescheduled her procedure to 05/09/2019.  [facility].  Pt is aware that I will mail her out new prep instructions and Covid screening information.

## 2019-03-12 ENCOUNTER — Ambulatory Visit: Payer: BC Managed Care – PPO | Admitting: Orthopedic Surgery

## 2019-03-19 ENCOUNTER — Ambulatory Visit: Payer: BC Managed Care – PPO | Admitting: Orthopedic Surgery

## 2019-03-19 ENCOUNTER — Other Ambulatory Visit: Payer: Self-pay

## 2019-03-19 VITALS — BP 129/88 | HR 83 | Temp 97.9°F | Ht 64.0 in | Wt 224.0 lb

## 2019-03-19 DIAGNOSIS — M75121 Complete rotator cuff tear or rupture of right shoulder, not specified as traumatic: Secondary | ICD-10-CM | POA: Diagnosis not present

## 2019-03-19 NOTE — Progress Notes (Signed)
Chief Complaint  Patient presents with  . Follow-up    Recheck RC Tear Right Shoulder    Betty Wu is doing well she is 54 years old she has a asymptomatic cuff tear or minimally symptomatic cuff tear.  She had an injection she did exercises she has had an MRI she has mild discomfort.  Her exam shows that she can flex the arm fully has excellent equal strength on both sides has normal external rotation with her arm at her side negative impingement but she would like an injection because she says at nighttime it does hurt  Shoulder prepped for injection subacromial space   Procedure note the subacromial injection shoulder RIGHT    Verbal consent was obtained to inject the  RIGHT   Shoulder  Timeout was completed to confirm the injection site is a subacromial space of the  RIGHT  shoulder   Medication used Depo-Medrol 40 mg and lidocaine 1% 3 cc  Anesthesia was provided by ethyl chloride  The injection was performed in the RIGHT  posterior subacromial space. After pinning the skin with alcohol and anesthetized the skin with ethyl chloride the subacromial space was injected using a 20-gauge needle. There were no complications  Sterile dressing was applied.  Encounter Diagnosis  Name Primary?  Marland Kitchen Nontraumatic complete tear of right rotator cuff Yes

## 2019-03-19 NOTE — Patient Instructions (Signed)

## 2019-04-15 ENCOUNTER — Other Ambulatory Visit (HOSPITAL_COMMUNITY): Payer: Self-pay | Admitting: Family Medicine

## 2019-04-15 DIAGNOSIS — Z1231 Encounter for screening mammogram for malignant neoplasm of breast: Secondary | ICD-10-CM

## 2019-04-16 DIAGNOSIS — Z23 Encounter for immunization: Secondary | ICD-10-CM | POA: Diagnosis not present

## 2019-04-24 ENCOUNTER — Ambulatory Visit (HOSPITAL_COMMUNITY): Payer: BC Managed Care – PPO

## 2019-04-24 ENCOUNTER — Ambulatory Visit (HOSPITAL_COMMUNITY)
Admission: RE | Admit: 2019-04-24 | Discharge: 2019-04-24 | Disposition: A | Discharge status: Home / Self Care | Payer: BC Managed Care – PPO | Source: Ambulatory Visit | Attending: Family Medicine | Admitting: Family Medicine

## 2019-04-24 ENCOUNTER — Other Ambulatory Visit: Payer: Self-pay

## 2019-04-24 DIAGNOSIS — Z1231 Encounter for screening mammogram for malignant neoplasm of breast: Secondary | ICD-10-CM | POA: Insufficient documentation

## 2019-05-01 ENCOUNTER — Telehealth: Payer: Self-pay | Admitting: Internal Medicine

## 2019-05-01 ENCOUNTER — Other Ambulatory Visit: Payer: Self-pay | Admitting: *Deleted

## 2019-05-01 MED ORDER — NA SULFATE-K SULFATE-MG SULF 17.5-3.13-1.6 GM/177ML PO SOLN: 1.0000 | Freq: Once | ORAL | 0 refills | Status: AC

## 2019-05-01 NOTE — Telephone Encounter (Signed)
Called pt back and made her aware that I am faxing the new prep instructions to [facility].  She is aware to pick them up when she picks up prep kit.

## 2019-05-01 NOTE — Telephone Encounter (Signed)
Called [facility] and spoke to S[place]y.  He quoted the cost of Suprep at $104.13.  Called pt and asked if she wanted me to send the RX for it or do Miralax prep.  Pt said that she would like to proceed with Suprep.  Sent new RX and forwarding to EG as a FYI since he signed off on original triage.

## 2019-05-01 NOTE — Telephone Encounter (Signed)
Pt is scheduled with [facility] on 3/26 and her prep is on backordered. She uses [facility] on [address], [place]. [phone]

## 2019-05-02 NOTE — Telephone Encounter (Signed)
Noted  

## 2019-05-06 ENCOUNTER — Other Ambulatory Visit (HOSPITAL_COMMUNITY): Payer: Self-pay | Admitting: *Deleted

## 2019-05-06 DIAGNOSIS — M5410 Radiculopathy, site unspecified: Secondary | ICD-10-CM | POA: Diagnosis not present

## 2019-05-06 DIAGNOSIS — M545 Low back pain: Secondary | ICD-10-CM | POA: Diagnosis not present

## 2019-05-06 DIAGNOSIS — Z6838 Body mass index (BMI) 38.0-38.9, adult: Secondary | ICD-10-CM | POA: Diagnosis not present

## 2019-05-06 DIAGNOSIS — Z1389 Encounter for screening for other disorder: Secondary | ICD-10-CM | POA: Diagnosis not present

## 2019-05-07 ENCOUNTER — Other Ambulatory Visit (HOSPITAL_COMMUNITY)
Admission: RE | Admit: 2019-05-07 | Discharge: 2019-05-07 | Disposition: A | Discharge status: Home / Self Care | Payer: BC Managed Care – PPO | Source: Ambulatory Visit | Attending: Internal Medicine | Admitting: Internal Medicine

## 2019-05-07 ENCOUNTER — Other Ambulatory Visit: Payer: Self-pay

## 2019-05-07 DIAGNOSIS — Z20822 Contact with and (suspected) exposure to covid-19: Secondary | ICD-10-CM | POA: Insufficient documentation

## 2019-05-07 DIAGNOSIS — Z01812 Encounter for preprocedural laboratory examination: Secondary | ICD-10-CM | POA: Insufficient documentation

## 2019-05-07 LAB — SARS CORONAVIRUS 2 (TAT 6-24 HRS): SARS Coronavirus 2: NEGATIVE

## 2019-05-09 ENCOUNTER — Other Ambulatory Visit: Payer: Self-pay

## 2019-05-09 ENCOUNTER — Encounter (HOSPITAL_COMMUNITY)
Admission: RE | Disposition: A | Discharge status: Home / Self Care | Payer: Self-pay | Source: Home / Self Care | Attending: Internal Medicine

## 2019-05-09 ENCOUNTER — Ambulatory Visit (HOSPITAL_COMMUNITY)
Admission: RE | Admit: 2019-05-09 | Discharge: 2019-05-09 | Disposition: A | Discharge status: Home / Self Care | Payer: BC Managed Care – PPO | Attending: Internal Medicine | Admitting: Internal Medicine

## 2019-05-09 DIAGNOSIS — Z9660 Presence of unspecified orthopedic joint implant: Secondary | ICD-10-CM | POA: Insufficient documentation

## 2019-05-09 DIAGNOSIS — I10 Essential (primary) hypertension: Secondary | ICD-10-CM | POA: Insufficient documentation

## 2019-05-09 DIAGNOSIS — Z79899 Other long term (current) drug therapy: Secondary | ICD-10-CM | POA: Insufficient documentation

## 2019-05-09 DIAGNOSIS — Z1211 Encounter for screening for malignant neoplasm of colon: Secondary | ICD-10-CM | POA: Insufficient documentation

## 2019-05-09 DIAGNOSIS — Z791 Long term (current) use of non-steroidal anti-inflammatories (NSAID): Secondary | ICD-10-CM | POA: Diagnosis not present

## 2019-05-09 DIAGNOSIS — Z803 Family history of malignant neoplasm of breast: Secondary | ICD-10-CM | POA: Diagnosis not present

## 2019-05-09 DIAGNOSIS — E119 Type 2 diabetes mellitus without complications: Secondary | ICD-10-CM | POA: Diagnosis not present

## 2019-05-09 HISTORY — PX: COLONOSCOPY: SHX5424

## 2019-05-09 SURGERY — COLONOSCOPY
Anesthesia: Moderate Sedation

## 2019-05-09 MED ORDER — MIDAZOLAM HCL 5 MG/5ML IJ SOLN
INTRAMUSCULAR | Status: DC | PRN
  Administered 2019-05-09: 2 mg via INTRAVENOUS
  Administered 2019-05-09: 1 mg via INTRAVENOUS
  Administered 2019-05-09: 2 mg via INTRAVENOUS

## 2019-05-09 MED ORDER — MIDAZOLAM HCL 5 MG/5ML IJ SOLN
INTRAMUSCULAR | Status: AC
  Filled 2019-05-09: qty 10

## 2019-05-09 MED ORDER — ONDANSETRON HCL 4 MG/2ML IJ SOLN
INTRAMUSCULAR | Status: DC | PRN
  Administered 2019-05-09: 4 mg via INTRAVENOUS

## 2019-05-09 MED ORDER — SODIUM CHLORIDE 0.9 % IV SOLN: INTRAVENOUS | Status: DC

## 2019-05-09 MED ORDER — MEPERIDINE HCL 100 MG/ML IJ SOLN
INTRAMUSCULAR | Status: DC | PRN
  Administered 2019-05-09: 25 mg via INTRAVENOUS
  Administered 2019-05-09: 10 mg via INTRAVENOUS

## 2019-05-09 MED ORDER — ONDANSETRON HCL 4 MG/2ML IJ SOLN
INTRAMUSCULAR | Status: AC
  Filled 2019-05-09: qty 2

## 2019-05-09 MED ORDER — MEPERIDINE HCL 50 MG/ML IJ SOLN
INTRAMUSCULAR | Status: AC
  Filled 2019-05-09: qty 1

## 2019-05-09 MED ORDER — STERILE WATER FOR IRRIGATION IR SOLN
Status: DC | PRN
  Administered 2019-05-09: 09:00:00 1.5 mL

## 2019-05-09 NOTE — Op Note (Signed)
[facility] Patient Name: Betty Wu Procedure Date: 05/09/2019 9:11 AM MRN: [id number] Date of Birth: [birth year] Attending MD: Gennette Pac , MD CSN: [id number] Age: 54 Admit Type: Outpatient Procedure:                Colonoscopy Indications:              Screening for colorectal malignant neoplasm Providers:                Gennette Pac, MD, Buel Ream. Thomasena Edis RN, RN,                            Edythe Clarity, Technician Referring MD:              Medicines:                Midazolam 5 mg IV, Meperidine 40 mg IV, Ondansetron                            4 mg IV Complications:            No immediate complications. Estimated Blood Loss:     Estimated blood loss: none. Procedure:                Pre-Anesthesia Assessment:                           - Prior to the procedure, a History and Physical                            was performed, and patient medications and                            allergies were reviewed. The patient's tolerance of                            previous anesthesia was also reviewed. The risks                            and benefits of the procedure and the sedation                            options and risks were discussed with the patient.                            All questions were answered, and informed consent                            was obtained. ASA Grade Assessment: II - A patient                            with mild systemic disease. After reviewing the                            risks and benefits, the patient was deemed in  satisfactory condition to undergo the procedure.                           After obtaining informed consent, the colonoscope                            was passed under direct vision. Throughout the                            procedure, the patient's blood pressure, pulse, and                            oxygen saturations were monitored continuously. The   CF-[id number] ([id number]) scope was introduced through                            the anus and advanced to the the cecum, identified                            by appendiceal orifice and ileocecal valve. The                            colonoscopy was performed without difficulty. The                            patient tolerated the procedure well. The quality                            of the bowel preparation was adequate. Scope In: 9:19:10 AM Scope Out: 9:31:35 AM Scope Withdrawal Time: 0 hours 6 minutes 24 seconds  Total Procedure Duration: 0 hours 12 minutes 25 seconds  Findings:      The perianal and digital rectal examinations were normal.      The colon (entire examined portion) appeared normal.      The retroflexed view of the distal rectum and anal verge was normal and       showed no anal or rectal abnormalities. Impression:               - The entire examined colon is normal.                           - The distal rectum and anal verge are normal on                            retroflexion view.                           - No specimens collected. Moderate Sedation:      Moderate (conscious) sedation was administered by the endoscopy nurse       and supervised by the endoscopist. The following parameters were       monitored: oxygen saturation, heart rate, blood pressure, respiratory       rate, EKG, adequacy of pulmonary ventilation, and response to care.       Total physician intraservice time was 16 minutes. Recommendation:           -  Patient has a contact number available for                            emergencies. The signs and symptoms of potential                            delayed complications were discussed with the                            patient. Return to normal activities tomorrow.                            Written discharge instructions were provided to the                            patient.                           - Resume previous diet.                            - Continue present medications.                           - Repeat colonoscopy in 10 years for screening                            purposes.                           - Return to GI office PRN. Procedure Code(s):        --- Professional ---                           [phone], Colonoscopy, flexible; diagnostic, including                            collection of specimen(s) by brushing or washing,                            when performed (separate procedure)                           G0500, Moderate sedation services provided by the                            same physician or other qualified health care                            professional performing a gastrointestinal                            endoscopic service that sedation supports,                            requiring the presence of an independent trained  observer to assist in the monitoring of the                            patient's level of consciousness and physiological                            status; initial 15 minutes of intra-service time;                            patient age 50 years or older (additional time may                            be reported with 38101, as appropriate) Diagnosis Code(s):        --- Professional ---                           Z12.11, Encounter for screening for malignant                            neoplasm of colon CPT copyright 2019 American Medical Association. All rights reserved. The codes documented in this report are preliminary and upon coder review may  be revised to meet current compliance requirements. Gerrit Friends. Lanetra Hartley, MD Gennette Pac, MD 05/09/2019 9:34:29 AM This report has been signed electronically. Number of Addenda: 0

## 2019-05-09 NOTE — Discharge Instructions (Signed)
  Colonoscopy Discharge Instructions  Read the instructions outlined below and refer to this sheet in the next few weeks. These discharge instructions provide you with general information on caring for yourself after you leave the hospital. Your doctor may also give you specific instructions. While your treatment has been planned according to the most current medical practices available, unavoidable complications occasionally occur. If you have any problems or questions after discharge, call Dr. Jena Gauss at [phone]. ACTIVITY  You may resume your regular activity, but move at a slower pace for the next 24 hours.   Take frequent rest periods for the next 24 hours.   Walking will help get rid of the air and reduce the bloated feeling in your belly (abdomen).   No driving for 24 hours (because of the medicine (anesthesia) used during the test).    Do not sign any important legal documents or operate any machinery for 24 hours (because of the anesthesia used during the test).  NUTRITION  Drink plenty of fluids.   You may resume your normal diet as instructed by your doctor.   Begin with a light meal and progress to your normal diet. Heavy or fried foods are harder to digest and may make you feel sick to your stomach (nauseated).   Avoid alcoholic beverages for 24 hours or as instructed.  MEDICATIONS  You may resume your normal medications unless your doctor tells you otherwise.  WHAT YOU CAN EXPECT TODAY  Some feelings of bloating in the abdomen.   Passage of more gas than usual.   Spotting of blood in your stool or on the toilet paper.  IF YOU HAD POLYPS REMOVED DURING THE COLONOSCOPY:  No aspirin products for 7 days or as instructed.   No alcohol for 7 days or as instructed.   Eat a soft diet for the next 24 hours.  FINDING OUT THE RESULTS OF YOUR TEST Not all test results are available during your visit. If your test results are not back during the visit, make an appointment  with your caregiver to find out the results. Do not assume everything is normal if you have not heard from your caregiver or the medical facility. It is important for you to follow up on all of your test results.  SEEK IMMEDIATE MEDICAL ATTENTION IF:  You have more than a spotting of blood in your stool.   Your belly is swollen (abdominal distention).   You are nauseated or vomiting.   You have a temperature over 101.   You have abdominal pain or discomfort that is severe or gets worse throughout the day.    Your colon was normal today!  I recommend you have a repeat colonoscopy for screening purposes in 10 years  At patient request I called Fayrene Fearing at [phone] and reviewed results

## 2019-05-09 NOTE — H&P (Signed)
@LOGO @   Primary Care Physician:  , MD Primary Gastroenterologist:  Dr. Assunta Found  Pre-Procedure History & Physical: HPI:  Betty Wu is a 54 y.o. female is here for a screening colonoscopy.  No bowel symptoms.  No prior colonoscopy.  No family history of colon cancer.  Past Medical History:  Diagnosis Date  . Diabetes mellitus without complication (HCC)    prediabetic  . Hypertension     Past Surgical History:  Procedure Laterality Date  . JOINT REPLACEMENT      Prior to Admission medications   Medication Sig Start Date End Date Taking? Authorizing Provider  acetaminophen (TYLENOL) 500 MG tablet Take 500 mg by mouth every 6 (six) hours as needed.   Yes [provider]  ibuprofen (ADVIL) 800 MG tablet Take 1 tablet (800 mg total) by mouth every 8 (eight) hours as needed. 10/30/18  Yes 11/01/18, MD  loratadine (CLARITIN) 10 MG tablet Take 10 mg by mouth daily. 07/26/18  Yes [provider]  olmesartan-hydrochlorothiazide (BENICAR HCT) 20-12.5 MG tablet Take 1 tablet by mouth daily. 12/04/18  Yes [provider]    Allergies as of 11/27/2018  . (No Known Allergies)    Family History  Problem Relation Age of Onset  . Breast cancer Maternal Aunt        40's  . Breast cancer Maternal Grandmother        50's  . Breast cancer Cousin        2 maternal cousins    Social History   Socioeconomic History  . Marital status: Married    Spouse name: Not on file  . Number of children: Not on file  . Years of education: Not on file  . Highest education level: Not on file  Occupational History  . Not on file  Tobacco Use  . Smoking status: Never Smoker  . Smokeless tobacco: Never Used  Substance and Sexual Activity  . Alcohol use: Not Currently  . Drug use: Not Currently  . Sexual activity: Not on file  Other Topics Concern  . Not on file  Social History Narrative  . Not on file   Social Determinants of Health    Financial Resource Strain:   . Difficulty of Paying Living Expenses:   Food Insecurity:   . Worried About 11/29/2018 in the Last Year:   . Programme researcher, broadcasting/film/video in the Last Year:   Transportation Needs:   . Barista (Medical):   Freight forwarder Lack of Transportation (Non-Medical):   Physical Activity:   . Days of Exercise per Week:   . Minutes of Exercise per Session:   Stress:   . Feeling of Stress :   Social Connections:   . Frequency of Communication with Friends and Family:   . Frequency of Social Gatherings with Friends and Family:   . Attends Religious Services:   . Active Member of Clubs or Organizations:   . Attends [employer] Meetings:   Banker Marital Status:   Intimate Partner Violence:   . Fear of Current or Ex-Partner:   . Emotionally Abused:   [employer] Physically Abused:   . Sexually Abused:     Review of Systems: See HPI, otherwise negative ROS  Physical Exam: BP 133/85   Pulse 71   Temp 98.9 F (37.2 C) (Oral)   Resp 15   Ht 5\' 4"  (1.626 m)   SpO2 99%   BMI 38.45 kg/m  General:  Alert,  Well-developed, well-nourished, pleasant and cooperative in NAD Lungs:  Clear throughout to auscultation.   No wheezes, crackles, or rhonchi. No acute distress. Heart:  Regular rate and rhythm; no murmurs, clicks, rubs,  or gallops. Abdomen:  Soft, nontender and nondistended. No masses, hepatosplenomegaly or hernias noted. Normal bowel sounds, without guarding, and without rebound.    Impression/Plan: Betty Wu is now here to undergo a screening colonoscopy.  First ever average rescreening examination.  Risks, benefits, limitations, imponderables and alternatives regarding colonoscopy have been reviewed with the patient. Questions have been answered. All parties agreeable.     Notice:  This dictation was prepared with Dragon dictation along with smaller phrase technology. Any transcriptional errors that result from this process are unintentional and may  not be corrected upon review.

## 2019-11-18 ENCOUNTER — Other Ambulatory Visit: Payer: Self-pay | Admitting: *Deleted

## 2019-11-18 ENCOUNTER — Other Ambulatory Visit: Payer: BC Managed Care – PPO

## 2019-11-18 DIAGNOSIS — Z20822 Contact with and (suspected) exposure to covid-19: Secondary | ICD-10-CM | POA: Diagnosis not present

## 2019-11-19 LAB — NOVEL CORONAVIRUS, NAA: SARS-CoV-2, NAA: NOT DETECTED

## 2019-11-19 LAB — SARS-COV-2, NAA 2 DAY TAT

## 2020-02-19 DIAGNOSIS — R498 Other voice and resonance disorders: Secondary | ICD-10-CM | POA: Diagnosis not present

## 2020-02-19 DIAGNOSIS — J069 Acute upper respiratory infection, unspecified: Secondary | ICD-10-CM | POA: Diagnosis not present

## 2020-03-09 ENCOUNTER — Other Ambulatory Visit: Payer: Self-pay

## 2020-03-09 ENCOUNTER — Other Ambulatory Visit: Payer: BC Managed Care – PPO

## 2020-03-09 DIAGNOSIS — Z20822 Contact with and (suspected) exposure to covid-19: Secondary | ICD-10-CM

## 2020-03-10 ENCOUNTER — Other Ambulatory Visit (HOSPITAL_COMMUNITY): Payer: Self-pay | Admitting: Family Medicine

## 2020-03-10 DIAGNOSIS — Z1231 Encounter for screening mammogram for malignant neoplasm of breast: Secondary | ICD-10-CM

## 2020-03-11 LAB — NOVEL CORONAVIRUS, NAA: SARS-CoV-2, NAA: NOT DETECTED

## 2020-03-11 LAB — SARS-COV-2, NAA 2 DAY TAT

## 2020-04-06 DIAGNOSIS — Z1331 Encounter for screening for depression: Secondary | ICD-10-CM | POA: Diagnosis not present

## 2020-04-06 DIAGNOSIS — T464X5A Adverse effect of angiotensin-converting-enzyme inhibitors, initial encounter: Secondary | ICD-10-CM | POA: Diagnosis not present

## 2020-04-06 DIAGNOSIS — Z1389 Encounter for screening for other disorder: Secondary | ICD-10-CM | POA: Diagnosis not present

## 2020-04-06 DIAGNOSIS — T464X5D Adverse effect of angiotensin-converting-enzyme inhibitors, subsequent encounter: Secondary | ICD-10-CM | POA: Diagnosis not present

## 2020-04-06 DIAGNOSIS — Z0001 Encounter for general adult medical examination with abnormal findings: Secondary | ICD-10-CM | POA: Diagnosis not present

## 2020-04-06 DIAGNOSIS — I10 Essential (primary) hypertension: Secondary | ICD-10-CM | POA: Diagnosis not present

## 2020-04-06 DIAGNOSIS — R7309 Other abnormal glucose: Secondary | ICD-10-CM | POA: Diagnosis not present

## 2020-04-06 DIAGNOSIS — Z6839 Body mass index (BMI) 39.0-39.9, adult: Secondary | ICD-10-CM | POA: Diagnosis not present

## 2020-04-12 DIAGNOSIS — K219 Gastro-esophageal reflux disease without esophagitis: Secondary | ICD-10-CM | POA: Diagnosis not present

## 2020-04-12 DIAGNOSIS — H6123 Impacted cerumen, bilateral: Secondary | ICD-10-CM | POA: Diagnosis not present

## 2020-04-26 ENCOUNTER — Ambulatory Visit (HOSPITAL_COMMUNITY)
Admission: RE | Admit: 2020-04-26 | Discharge: 2020-04-26 | Disposition: A | Discharge status: Home / Self Care | Payer: BC Managed Care – PPO | Source: Ambulatory Visit | Attending: Family Medicine | Admitting: Family Medicine

## 2020-04-26 ENCOUNTER — Other Ambulatory Visit: Payer: Self-pay

## 2020-04-26 DIAGNOSIS — Z1231 Encounter for screening mammogram for malignant neoplasm of breast: Secondary | ICD-10-CM | POA: Diagnosis not present

## 2020-05-19 DIAGNOSIS — E7800 Pure hypercholesterolemia, unspecified: Secondary | ICD-10-CM | POA: Insufficient documentation

## 2020-05-19 DIAGNOSIS — K219 Gastro-esophageal reflux disease without esophagitis: Secondary | ICD-10-CM | POA: Diagnosis not present

## 2020-10-20 DIAGNOSIS — Z6838 Body mass index (BMI) 38.0-38.9, adult: Secondary | ICD-10-CM | POA: Diagnosis not present

## 2020-10-20 DIAGNOSIS — M461 Sacroiliitis, not elsewhere classified: Secondary | ICD-10-CM | POA: Diagnosis not present

## 2021-02-01 DIAGNOSIS — I10 Essential (primary) hypertension: Secondary | ICD-10-CM | POA: Diagnosis not present

## 2021-02-01 DIAGNOSIS — J329 Chronic sinusitis, unspecified: Secondary | ICD-10-CM | POA: Diagnosis not present

## 2021-03-30 ENCOUNTER — Other Ambulatory Visit (HOSPITAL_COMMUNITY): Payer: Self-pay | Admitting: Family Medicine

## 2021-03-30 DIAGNOSIS — Z1231 Encounter for screening mammogram for malignant neoplasm of breast: Secondary | ICD-10-CM

## 2021-04-29 ENCOUNTER — Ambulatory Visit (HOSPITAL_COMMUNITY)
Admission: RE | Admit: 2021-04-29 | Discharge: 2021-04-29 | Disposition: A | Discharge status: Home / Self Care | Payer: BC Managed Care – PPO | Source: Ambulatory Visit | Attending: Family Medicine | Admitting: Family Medicine

## 2021-04-29 ENCOUNTER — Other Ambulatory Visit: Payer: Self-pay

## 2021-04-29 DIAGNOSIS — Z1231 Encounter for screening mammogram for malignant neoplasm of breast: Secondary | ICD-10-CM | POA: Diagnosis not present

## 2021-08-29 DIAGNOSIS — H1033 Unspecified acute conjunctivitis, bilateral: Secondary | ICD-10-CM | POA: Diagnosis not present

## 2021-10-24 ENCOUNTER — Telehealth: Payer: Self-pay | Admitting: Orthopedic Surgery

## 2021-10-24 NOTE — Telephone Encounter (Signed)
Patient called to request appointment for her left knee pain, and difficulty bending, for as soon as possible, and if possible, with Dr Romeo Apple, whom she has last seen in 2021. I also offered option of urgent care, or emergency room, based on this information, and also offered with Dr Hilda Lias this week, although the times were not working out for patient.  I scheduled patient for next Monday, 10/31/21, with Dr Romeo Apple, and patient then also relayed history of total knee replacement surgery on both knees, in 2018. Asking for review (patient aware) in event we would need to proceed differently.

## 2021-10-31 ENCOUNTER — Encounter: Payer: Self-pay | Admitting: Orthopedic Surgery

## 2021-10-31 ENCOUNTER — Ambulatory Visit: Payer: BC Managed Care – PPO | Admitting: Orthopedic Surgery

## 2021-10-31 ENCOUNTER — Ambulatory Visit (INDEPENDENT_AMBULATORY_CARE_PROVIDER_SITE_OTHER): Payer: BC Managed Care – PPO

## 2021-10-31 VITALS — BP 180/97 | HR 77 | Ht 64.0 in | Wt 214.0 lb

## 2021-10-31 DIAGNOSIS — M25562 Pain in left knee: Secondary | ICD-10-CM

## 2021-10-31 DIAGNOSIS — Z96652 Presence of left artificial knee joint: Secondary | ICD-10-CM

## 2021-10-31 DIAGNOSIS — S83412A Sprain of medial collateral ligament of left knee, initial encounter: Secondary | ICD-10-CM

## 2021-10-31 NOTE — Progress Notes (Signed)
Chief Complaint  Patient presents with   Knee Pain    Left/ fell 10/22/21    HPI: 56 year old female had total knees done in [facility] did well except for some intermittent pain that she has had over the course of the postop.  She had left knee replaced in 2018  The right knee were not sure on the date  She fell about a week ago comes in complaining of pain and swelling some stiffness seems to be getting better she is using a cane  Past Medical History:  Diagnosis Date   Diabetes mellitus without complication (HCC)    prediabetic   Hypertension     BP (!) 180/97   Pulse 77   Ht 5\' 4"  (1.626 m)   Wt 214 lb (97.1 kg)   BMI 36.73 kg/m    General appearance: Well-developed well-nourished no gross deformities  Cardiovascular normal pulse and perfusion normal color without edema  Neurologically no sensation loss or deficits or pathologic reflexes  Psychological: Awake alert and oriented x3 mood and affect normal  Skin no lacerations or ulcerations no nodularity no palpable masses, no erythema or nodularity  Musculoskeletal: Supported by cane  Patient has a's bruised area on the superomedial aspect of the knee some tenderness along the medial collateral ligament but the knee is stable  Imaging shows a well-placed fixed total knee  A/P  Sprain left knee status post total knee  Recommend ice, ibuprofen, Tylenol, knee exercises, cane as needed  Encounter Diagnoses  Name Primary?   Acute pain of left knee Yes   History of left knee replacement 2018 in [place] of medial collateral ligament of left knee, initial encounter     Follow-up as needed

## 2021-11-17 DIAGNOSIS — H18213 Corneal edema secondary to contact lens, bilateral: Secondary | ICD-10-CM | POA: Diagnosis not present

## 2021-11-23 DIAGNOSIS — H16143 Punctate keratitis, bilateral: Secondary | ICD-10-CM | POA: Diagnosis not present

## 2021-12-13 DIAGNOSIS — T464X5D Adverse effect of angiotensin-converting-enzyme inhibitors, subsequent encounter: Secondary | ICD-10-CM | POA: Diagnosis not present

## 2021-12-13 DIAGNOSIS — Z1331 Encounter for screening for depression: Secondary | ICD-10-CM | POA: Diagnosis not present

## 2021-12-13 DIAGNOSIS — N951 Menopausal and female climacteric states: Secondary | ICD-10-CM | POA: Diagnosis not present

## 2021-12-13 DIAGNOSIS — E119 Type 2 diabetes mellitus without complications: Secondary | ICD-10-CM | POA: Diagnosis not present

## 2021-12-13 DIAGNOSIS — Z0000 Encounter for general adult medical examination without abnormal findings: Secondary | ICD-10-CM | POA: Diagnosis not present

## 2021-12-13 DIAGNOSIS — Z111 Encounter for screening for respiratory tuberculosis: Secondary | ICD-10-CM | POA: Diagnosis not present

## 2021-12-13 DIAGNOSIS — E6609 Other obesity due to excess calories: Secondary | ICD-10-CM | POA: Diagnosis not present

## 2021-12-13 DIAGNOSIS — Z6837 Body mass index (BMI) 37.0-37.9, adult: Secondary | ICD-10-CM | POA: Diagnosis not present

## 2021-12-13 DIAGNOSIS — I10 Essential (primary) hypertension: Secondary | ICD-10-CM | POA: Diagnosis not present

## 2022-01-23 DIAGNOSIS — R748 Abnormal levels of other serum enzymes: Secondary | ICD-10-CM | POA: Diagnosis not present

## 2022-03-27 ENCOUNTER — Other Ambulatory Visit (HOSPITAL_COMMUNITY): Payer: Self-pay | Admitting: Family Medicine

## 2022-03-27 DIAGNOSIS — Z1231 Encounter for screening mammogram for malignant neoplasm of breast: Secondary | ICD-10-CM

## 2022-04-13 ENCOUNTER — Encounter: Payer: Self-pay | Admitting: Radiology

## 2022-04-24 ENCOUNTER — Ambulatory Visit (INDEPENDENT_AMBULATORY_CARE_PROVIDER_SITE_OTHER): Payer: BC Managed Care – PPO | Admitting: Orthopedic Surgery

## 2022-04-24 ENCOUNTER — Encounter: Payer: Self-pay | Admitting: Orthopedic Surgery

## 2022-04-24 ENCOUNTER — Ambulatory Visit (INDEPENDENT_AMBULATORY_CARE_PROVIDER_SITE_OTHER): Payer: BC Managed Care – PPO

## 2022-04-24 VITALS — BP 181/107 | HR 93 | Ht 64.0 in | Wt 210.0 lb

## 2022-04-24 DIAGNOSIS — M25511 Pain in right shoulder: Secondary | ICD-10-CM | POA: Diagnosis not present

## 2022-04-24 DIAGNOSIS — G8929 Other chronic pain: Secondary | ICD-10-CM | POA: Diagnosis not present

## 2022-04-24 DIAGNOSIS — M75101 Unspecified rotator cuff tear or rupture of right shoulder, not specified as traumatic: Secondary | ICD-10-CM

## 2022-04-24 MED ORDER — METHYLPREDNISOLONE ACETATE 40 MG/ML IJ SUSP
40.0000 mg | Freq: Once | INTRAMUSCULAR | Status: AC
  Administered 2022-04-24: 40 mg via INTRA_ARTICULAR

## 2022-04-24 NOTE — Patient Instructions (Signed)

## 2022-04-24 NOTE — Progress Notes (Signed)
Chief Complaint  Patient presents with   Shoulder Pain    RT shoulder radiating into upper arm   57 year old female with chronic rotator cuff tear documented by MRI about 2 or 3 years ago presents with recurrent acute on chronic right shoulder pain radiating into the upper part of her right arm  She denies any neck pain  The shoulder exam shows she has rotator cuff strength 4 out of 5 in abduction with increased pain 5 out of 5 with flexion with less pain she does have impingement  Her x-ray shows no arthritis just some subacromial sclerosis  Recommend subacromial injection and Codman exercises  Should improve    Procedure note the subacromial injection shoulder RIGHT    Verbal consent was obtained to inject the  RIGHT   Shoulder  Timeout was completed to confirm the injection site is a subacromial space of the  RIGHT  shoulder   Medication used Depo-Medrol 40 mg and lidocaine 1% 3 cc  Anesthesia was provided by ethyl chloride  The injection was performed in the RIGHT  posterior subacromial space. After pinning the skin with alcohol and anesthetized the skin with ethyl chloride the subacromial space was injected using a 20-gauge needle. There were no complications  Sterile dressing was applied.

## 2022-04-28 DIAGNOSIS — N951 Menopausal and female climacteric states: Secondary | ICD-10-CM | POA: Diagnosis not present

## 2022-05-03 ENCOUNTER — Ambulatory Visit (HOSPITAL_COMMUNITY)
Admission: RE | Admit: 2022-05-03 | Discharge: 2022-05-03 | Disposition: A | Discharge status: Home / Self Care | Payer: BC Managed Care – PPO | Source: Ambulatory Visit | Attending: Family Medicine | Admitting: Family Medicine

## 2022-05-03 ENCOUNTER — Encounter (HOSPITAL_COMMUNITY): Payer: Self-pay

## 2022-05-03 ENCOUNTER — Ambulatory Visit: Payer: BC Managed Care – PPO | Admitting: Orthopedic Surgery

## 2022-05-03 DIAGNOSIS — Z1231 Encounter for screening mammogram for malignant neoplasm of breast: Secondary | ICD-10-CM | POA: Diagnosis not present

## 2022-05-03 DIAGNOSIS — I10 Essential (primary) hypertension: Secondary | ICD-10-CM | POA: Diagnosis not present

## 2022-06-08 ENCOUNTER — Other Ambulatory Visit: Payer: Self-pay

## 2022-06-08 ENCOUNTER — Encounter (HOSPITAL_COMMUNITY): Payer: Self-pay | Admitting: Emergency Medicine

## 2022-06-08 ENCOUNTER — Emergency Department (HOSPITAL_COMMUNITY)
Admission: EM | Admit: 2022-06-08 | Discharge: 2022-06-08 | Disposition: A | Discharge status: Home / Self Care | Payer: BC Managed Care – PPO | Attending: Emergency Medicine | Admitting: Emergency Medicine

## 2022-06-08 DIAGNOSIS — I10 Essential (primary) hypertension: Secondary | ICD-10-CM | POA: Diagnosis not present

## 2022-06-08 DIAGNOSIS — M5441 Lumbago with sciatica, right side: Secondary | ICD-10-CM | POA: Diagnosis not present

## 2022-06-08 DIAGNOSIS — N3000 Acute cystitis without hematuria: Secondary | ICD-10-CM | POA: Diagnosis not present

## 2022-06-08 DIAGNOSIS — M545 Low back pain, unspecified: Secondary | ICD-10-CM | POA: Diagnosis not present

## 2022-06-08 LAB — URINALYSIS, ROUTINE W REFLEX MICROSCOPIC
Bilirubin Urine: NEGATIVE
Glucose, UA: NEGATIVE mg/dL
Hgb urine dipstick: NEGATIVE
Ketones, ur: 5 mg/dL — AB
Nitrite: NEGATIVE
Protein, ur: NEGATIVE mg/dL
Specific Gravity, Urine: 1.024 (ref 1.005–1.030)
pH: 5 (ref 5.0–8.0)

## 2022-06-08 MED ORDER — CEPHALEXIN 500 MG PO CAPS: 500.0000 mg | ORAL_CAPSULE | Freq: Three times a day (TID) | ORAL | 0 refills | Status: AC

## 2022-06-08 MED ORDER — PREDNISONE 50 MG PO TABS
60.0000 mg | ORAL_TABLET | Freq: Once | ORAL | Status: AC
  Administered 2022-06-08: 60 mg via ORAL
  Filled 2022-06-08: qty 1

## 2022-06-08 MED ORDER — PREDNISONE 10 MG (21) PO TBPK: ORAL_TABLET | Freq: Every day | ORAL | 0 refills | Status: AC

## 2022-06-08 NOTE — Discharge Instructions (Signed)
Your urine shows some signs of infection.  Will send this for urine culture to confirm.  Given the reproducible pain that radiates down right leg we will treat you for sciatic nerve flareup as well.  I have sent prednisone, an antibiotic into the pharmacy for you.  For any concerning symptoms return to the emergency department.  Otherwise follow-up with your primary care provider.

## 2022-06-08 NOTE — ED Triage Notes (Signed)
Right lower back pain x 1 week no injury. Pain worse with walking. Does not radiate.

## 2022-06-08 NOTE — ED Provider Notes (Signed)
[facility] EMERGENCY DEPARTMENT AT [facility] Provider Note   CSN: [id number] Arrival date & time: 06/08/22  1623     History  Chief Complaint  Patient presents with   Back Pain    NOEMY HALLMON is a 57 y.o. female.  56 year old female presents today for evaluation of right lower back pain that has been ongoing for about a week.  She states that it is worse when she is walking.  Does radiate down right lower extremity.  Denies history of kidney stones.  Denies any dysuria.  Without hematuria.  Without nausea, vomiting.  Has not tried anything over-the-counter.  The history is provided by the patient. No language interpreter was used.       Home Medications Prior to Admission medications   Medication Sig Start Date End Date Taking? Authorizing Provider  cyclobenzaprine (FLEXERIL) 5 MG tablet Take 5 mg by mouth 3 (three) times daily as needed for muscle spasms. 03/20/22   [provider]  loratadine (CLARITIN) 10 MG tablet Take 10 mg by mouth daily. 07/26/18   [provider]  olmesartan (BENICAR) 40 MG tablet Take 40 mg by mouth daily. 10/11/21   [provider]      Allergies    Patient has no known allergies.    Review of Systems   Review of Systems  Constitutional:  Negative for chills and fever.  Gastrointestinal:  Negative for abdominal pain, nausea and vomiting.  Genitourinary:  Positive for flank pain. Negative for dysuria and frequency.  Musculoskeletal:  Positive for back pain. Negative for arthralgias.  All other systems reviewed and are negative.   Physical Exam Updated Vital Signs BP (!) 196/93 (BP Location: Right Arm)   Pulse 79   Temp 98.1 F (36.7 C) (Oral)   Resp 16   Wt 95 kg   SpO2 100%   BMI 35.95 kg/m  Physical Exam Vitals and nursing note reviewed.  Constitutional:      General: She is not in acute distress.    Appearance: Normal appearance. She is not ill-appearing.  HENT:     Head: Normocephalic and  atraumatic.     Nose: Nose normal.  Eyes:     General: No scleral icterus.    Extraocular Movements: Extraocular movements intact.     Conjunctiva/sclera: Conjunctivae normal.  Cardiovascular:     Rate and Rhythm: Normal rate and regular rhythm.     Pulses: Normal pulses.  Pulmonary:     Effort: Pulmonary effort is normal. No respiratory distress.     Breath sounds: Normal breath sounds. No wheezing or rales.  Abdominal:     General: There is no distension.     Palpations: Abdomen is soft.     Tenderness: There is no abdominal tenderness. There is no guarding.  Musculoskeletal:        General: Normal range of motion.     Cervical back: Normal range of motion.     Comments: Cervical, thoracic, lumbar spine without tenderness to palpation.  There is some tenderness palpation over the right CVA region.  Straight leg raise test positive on the right.  Good range of motion in bilateral lower extremities with 5/5 strength in extensor and flexor muscle groups.  Skin:    General: Skin is warm and dry.  Neurological:     General: No focal deficit present.     Mental Status: She is alert. Mental status is at baseline.     ED Results / Procedures /  Treatments   Labs (all labs ordered are listed, but only abnormal results are displayed) Labs Reviewed  URINALYSIS, ROUTINE W REFLEX MICROSCOPIC    EKG None  Radiology No results found.  Procedures Procedures    Medications Ordered in ED Medications - No data to display  ED Course/ Medical Decision Making/ A&P                             Medical Decision Making Amount and/or Complexity of Data Reviewed Labs: ordered.  Risk Prescription drug management.   57 year old female presents with right lower back pain.  Denies any UTI symptoms.  However will obtain UTI.  No prior history of kidney stones.  Does have positive straight leg raise test.  Will obtain UA and if no UTI and no hematuria will treat for low back pain with  sciatica.  Patient is in agreement with this plan.  She is otherwise well-appearing.   UA without hematuria.  Trace leukocytes and 11-20 WBCs.  Will cover for UTI and send urine culture.  Will also cover for sciatic nerve impingement.  Patient agreeable with plan.  Appropriate for discharge.  Discharged in stable condition.   Final Clinical Impression(s) / ED Diagnoses Final diagnoses:  Acute cystitis without hematuria  Acute right-sided low back pain with right-sided sciatica    Rx / DC Orders ED Discharge Orders          Ordered    predniSONE (STERAPRED UNI-PAK 21 TAB) 10 MG (21) TBPK tablet  Daily        06/08/22 2236    cephALEXin (KEFLEX) 500 MG capsule  3 times daily        06/08/22 2236              Marita Kansas, PA-C 06/08/22 2239    Linwood Dibbles, MD 06/08/22 2328

## 2022-06-10 LAB — URINE CULTURE

## 2022-08-09 DIAGNOSIS — Z6836 Body mass index (BMI) 36.0-36.9, adult: Secondary | ICD-10-CM | POA: Diagnosis not present

## 2022-08-09 DIAGNOSIS — M67912 Unspecified disorder of synovium and tendon, left shoulder: Secondary | ICD-10-CM | POA: Diagnosis not present

## 2022-08-09 DIAGNOSIS — E6609 Other obesity due to excess calories: Secondary | ICD-10-CM | POA: Diagnosis not present

## 2022-08-09 DIAGNOSIS — I10 Essential (primary) hypertension: Secondary | ICD-10-CM | POA: Diagnosis not present

## 2022-12-22 DIAGNOSIS — E6609 Other obesity due to excess calories: Secondary | ICD-10-CM | POA: Diagnosis not present

## 2022-12-22 DIAGNOSIS — Z029 Encounter for administrative examinations, unspecified: Secondary | ICD-10-CM | POA: Diagnosis not present

## 2022-12-22 DIAGNOSIS — Z6835 Body mass index (BMI) 35.0-35.9, adult: Secondary | ICD-10-CM | POA: Diagnosis not present

## 2022-12-22 DIAGNOSIS — I10 Essential (primary) hypertension: Secondary | ICD-10-CM | POA: Diagnosis not present

## 2022-12-22 DIAGNOSIS — Z1331 Encounter for screening for depression: Secondary | ICD-10-CM | POA: Diagnosis not present

## 2022-12-22 DIAGNOSIS — Z0000 Encounter for general adult medical examination without abnormal findings: Secondary | ICD-10-CM | POA: Diagnosis not present

## 2022-12-22 DIAGNOSIS — E119 Type 2 diabetes mellitus without complications: Secondary | ICD-10-CM | POA: Diagnosis not present

## 2022-12-22 DIAGNOSIS — T464X5D Adverse effect of angiotensin-converting-enzyme inhibitors, subsequent encounter: Secondary | ICD-10-CM | POA: Diagnosis not present

## 2022-12-22 DIAGNOSIS — R7309 Other abnormal glucose: Secondary | ICD-10-CM | POA: Diagnosis not present

## 2022-12-22 DIAGNOSIS — Z23 Encounter for immunization: Secondary | ICD-10-CM | POA: Diagnosis not present

## 2023-01-02 DIAGNOSIS — E6609 Other obesity due to excess calories: Secondary | ICD-10-CM | POA: Diagnosis not present

## 2023-01-02 DIAGNOSIS — R6889 Other general symptoms and signs: Secondary | ICD-10-CM | POA: Diagnosis not present

## 2023-01-02 DIAGNOSIS — Z6835 Body mass index (BMI) 35.0-35.9, adult: Secondary | ICD-10-CM | POA: Diagnosis not present

## 2023-01-02 DIAGNOSIS — J069 Acute upper respiratory infection, unspecified: Secondary | ICD-10-CM | POA: Diagnosis not present

## 2023-01-02 DIAGNOSIS — Z20828 Contact with and (suspected) exposure to other viral communicable diseases: Secondary | ICD-10-CM | POA: Diagnosis not present

## 2023-04-19 ENCOUNTER — Other Ambulatory Visit (HOSPITAL_COMMUNITY): Payer: Self-pay | Admitting: Family Medicine

## 2023-04-19 DIAGNOSIS — Z1231 Encounter for screening mammogram for malignant neoplasm of breast: Secondary | ICD-10-CM

## 2023-06-04 ENCOUNTER — Encounter (HOSPITAL_COMMUNITY): Payer: Self-pay

## 2023-06-04 ENCOUNTER — Ambulatory Visit (HOSPITAL_COMMUNITY)
Admission: RE | Admit: 2023-06-04 | Discharge: 2023-06-04 | Disposition: A | Discharge status: Home / Self Care | Source: Ambulatory Visit | Attending: Family Medicine | Admitting: Family Medicine

## 2023-06-04 DIAGNOSIS — Z1231 Encounter for screening mammogram for malignant neoplasm of breast: Secondary | ICD-10-CM | POA: Insufficient documentation

## 2023-10-23 DIAGNOSIS — R112 Nausea with vomiting, unspecified: Secondary | ICD-10-CM | POA: Diagnosis not present

## 2023-11-08 NOTE — Progress Notes (Signed)
 Betty Wu                                          MRN: [id number]   11/08/2023   The [facility] Quality Team Specialist reviewed this patient medical record for the purposes of chart review for care gap closure. The following were reviewed: chart review for care gap closure-controlling blood pressure.    [facility] Quality Team

## 2023-12-18 DIAGNOSIS — R232 Flushing: Secondary | ICD-10-CM | POA: Diagnosis not present

## 2023-12-18 DIAGNOSIS — I10 Essential (primary) hypertension: Secondary | ICD-10-CM | POA: Diagnosis not present

## 2023-12-18 DIAGNOSIS — J3089 Other allergic rhinitis: Secondary | ICD-10-CM | POA: Diagnosis not present

## 2023-12-18 DIAGNOSIS — M7581 Other shoulder lesions, right shoulder: Secondary | ICD-10-CM | POA: Diagnosis not present

## 2024-03-09 IMAGING — MG MM DIGITAL SCREENING BILAT W/ TOMO AND CAD
6 of 12 series · 6 of 36 positions shown · non-contrast
Comparison: Previous exam(s).

CLINICAL DATA: Screening.

EXAM:
DIGITAL SCREENING BILATERAL MAMMOGRAM WITH TOMOSYNTHESIS AND CAD
TECHNIQUE: Bilateral screening digital craniocaudal and mediolateral oblique
mammograms were obtained. Bilateral screening digital breast
tomosynthesis was performed. The images were evaluated with
computer-aided detection.

[L CC synth-2D (1 of 2)]
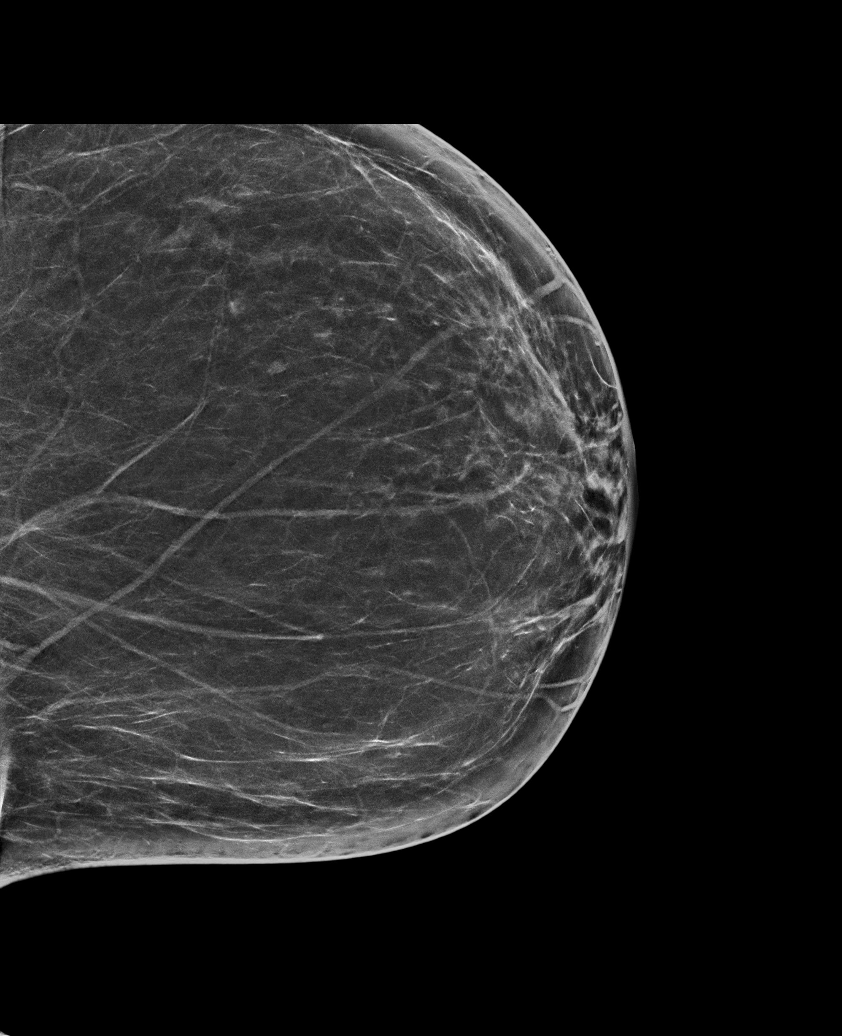

[L MLO synth-2D (1 of 2)]
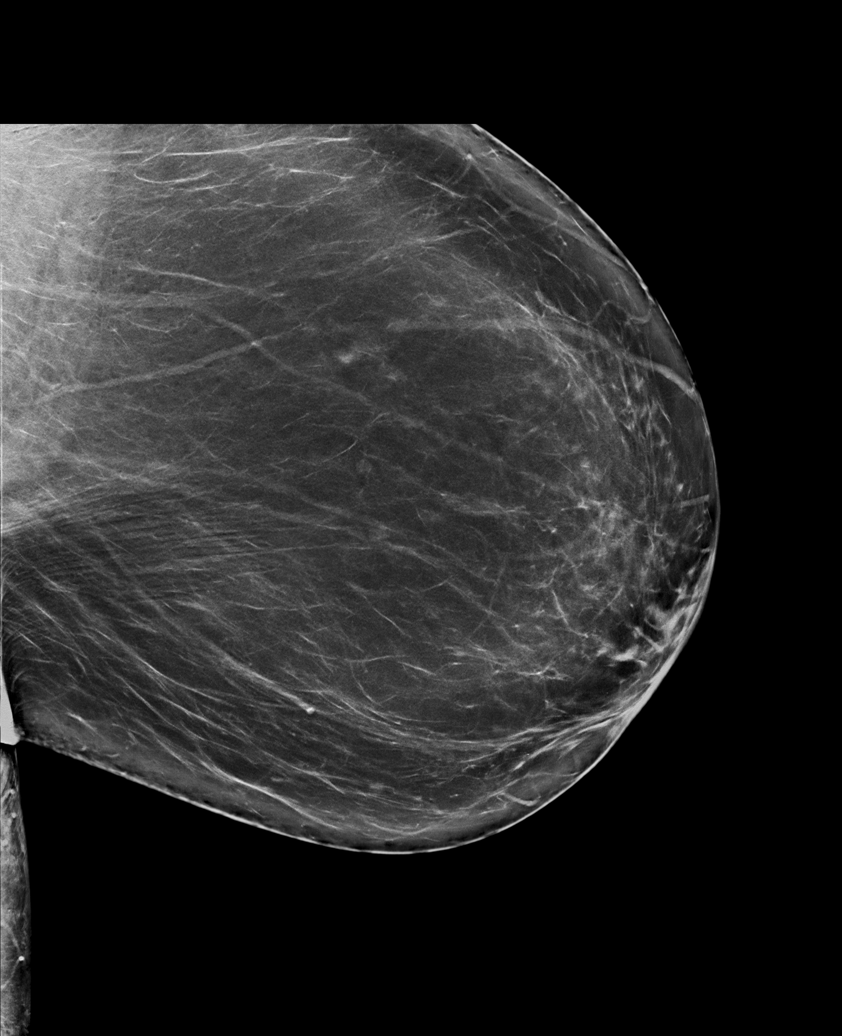

[R MLO synth-2D]
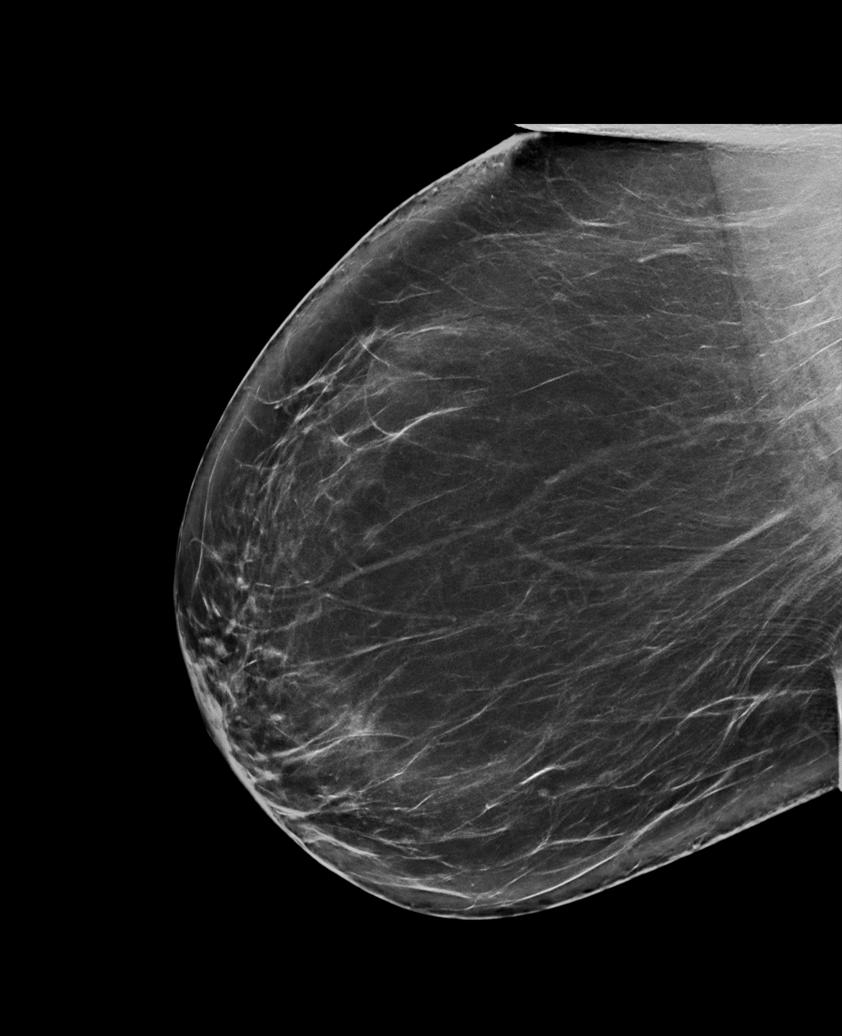

[L CC synth-2D (2 of 2)]
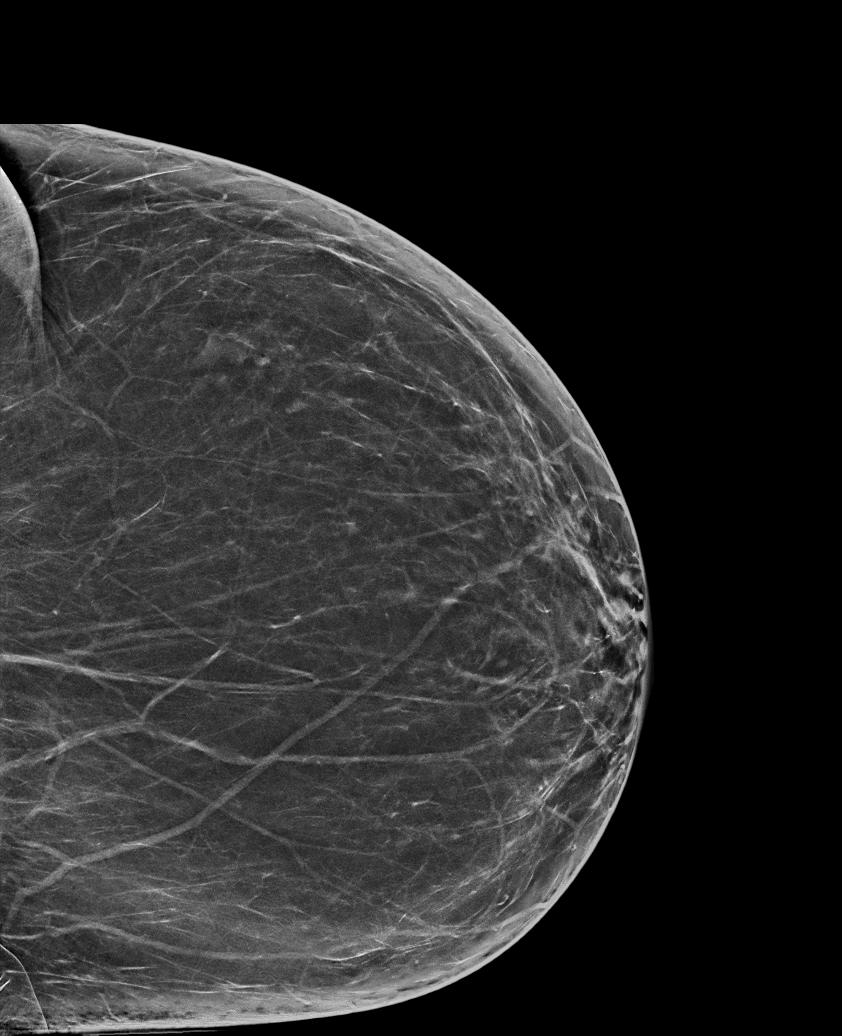

[R CC synth-2D]
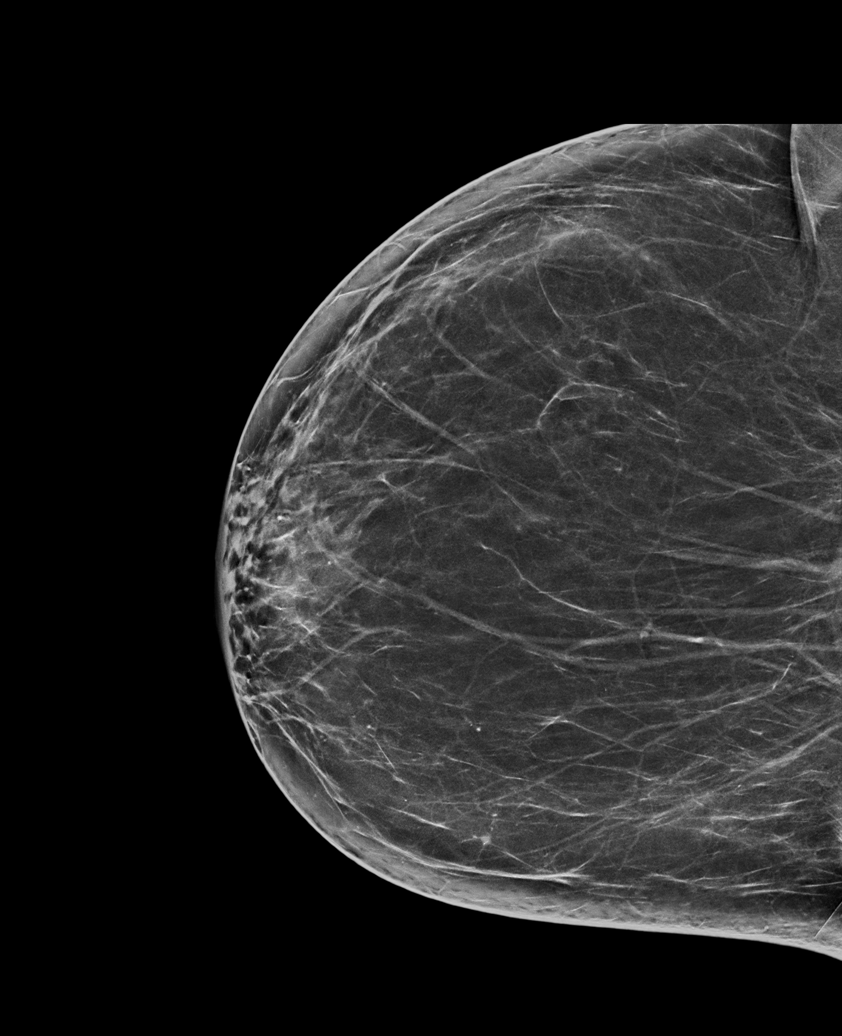

[L MLO synth-2D (2 of 2)]
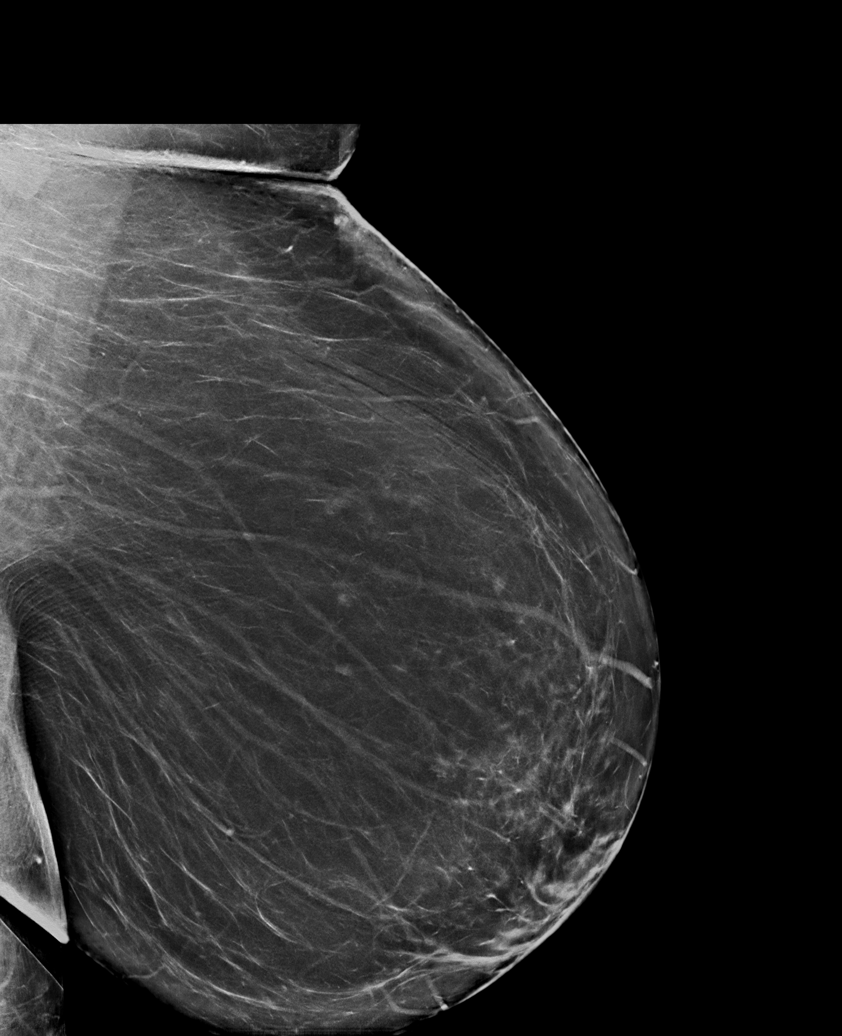

[6 of 36 positions shown; findings below may reference images not displayed]

ACR Breast Density Category b: There are scattered areas of
fibroglandular density.
FINDINGS: There are no findings suspicious for malignancy.
IMPRESSION: No mammographic evidence of malignancy. A result letter of this
screening mammogram will be mailed directly to the patient.

RECOMMENDATION:
Screening mammogram in one year. (Code:51-O-LD2)

BI-RADS CATEGORY  1: Negative.
